# Patient Record
Sex: Female | Born: 1965 | Race: White | Hispanic: No | Marital: Married | Smoking: Never smoker
Health system: Southern US, Community
[De-identification: ages and names within clinical notes are randomized; demographics above are authoritative.]

## PROBLEM LIST (undated history)

## (undated) DIAGNOSIS — G4700 Insomnia, unspecified: Secondary | ICD-10-CM

## (undated) DIAGNOSIS — R1084 Generalized abdominal pain: Secondary | ICD-10-CM

## (undated) DIAGNOSIS — K5792 Diverticulitis of intestine, part unspecified, without perforation or abscess without bleeding: Secondary | ICD-10-CM

## (undated) DIAGNOSIS — R194 Change in bowel habit: Secondary | ICD-10-CM

## (undated) DIAGNOSIS — N200 Calculus of kidney: Secondary | ICD-10-CM

## (undated) DIAGNOSIS — Z83438 Family history of other disorder of lipoprotein metabolism and other lipidemia: Secondary | ICD-10-CM

## (undated) DIAGNOSIS — R1013 Epigastric pain: Secondary | ICD-10-CM

## (undated) DIAGNOSIS — E785 Hyperlipidemia, unspecified: Secondary | ICD-10-CM

## (undated) DIAGNOSIS — E7800 Pure hypercholesterolemia, unspecified: Secondary | ICD-10-CM

## (undated) DIAGNOSIS — R112 Nausea with vomiting, unspecified: Secondary | ICD-10-CM

## (undated) DIAGNOSIS — N3000 Acute cystitis without hematuria: Secondary | ICD-10-CM

## (undated) DIAGNOSIS — K625 Hemorrhage of anus and rectum: Secondary | ICD-10-CM

## (undated) DIAGNOSIS — Z8616 Personal history of COVID-19: Secondary | ICD-10-CM

## (undated) DIAGNOSIS — F419 Anxiety disorder, unspecified: Secondary | ICD-10-CM

## (undated) DIAGNOSIS — G500 Trigeminal neuralgia: Secondary | ICD-10-CM

## (undated) DIAGNOSIS — K5900 Constipation, unspecified: Secondary | ICD-10-CM

## (undated) DIAGNOSIS — R203 Hyperesthesia: Secondary | ICD-10-CM

## (undated) DIAGNOSIS — R109 Unspecified abdominal pain: Secondary | ICD-10-CM

## (undated) DIAGNOSIS — Z8249 Family history of ischemic heart disease and other diseases of the circulatory system: Secondary | ICD-10-CM

## (undated) DIAGNOSIS — R599 Enlarged lymph nodes, unspecified: Secondary | ICD-10-CM

## (undated) HISTORY — DX: Unspecified abdominal pain: R10.9

## (undated) HISTORY — DX: Family history of other disorder of lipoprotein metabolism and other lipidemia: Z83.438

## (undated) HISTORY — DX: Calculus of kidney: N20.0

## (undated) HISTORY — PX: SHOULDER SURGERY: SHX246

## (undated) HISTORY — DX: Anxiety disorder, unspecified: F41.9

## (undated) HISTORY — DX: Diverticulitis of intestine, part unspecified, without perforation or abscess without bleeding: K57.92

## (undated) HISTORY — DX: Nausea with vomiting, unspecified: R11.2

## (undated) HISTORY — DX: Hemorrhage of anus and rectum: K62.5

## (undated) HISTORY — DX: Acute cystitis without hematuria: N30.00

## (undated) HISTORY — DX: Hyperlipidemia, unspecified: E78.5

## (undated) HISTORY — DX: Constipation, unspecified: K59.00

## (undated) HISTORY — DX: Insomnia, unspecified: G47.00

## (undated) HISTORY — DX: Personal history of COVID-19: Z86.16

## (undated) HISTORY — DX: Generalized abdominal pain: R10.84

## (undated) HISTORY — DX: Enlarged lymph nodes, unspecified: R59.9

## (undated) HISTORY — DX: Trigeminal neuralgia: G50.0

## (undated) HISTORY — DX: Pure hypercholesterolemia, unspecified: E78.00

## (undated) HISTORY — DX: Family history of ischemic heart disease and other diseases of the circulatory system: Z82.49

## (undated) HISTORY — DX: Epigastric pain: R10.13

## (undated) HISTORY — DX: Change in bowel habit: R19.4

## (undated) HISTORY — DX: Hyperesthesia: R20.3

---

## 1999-05-10 ENCOUNTER — Other Ambulatory Visit: Admission: RE | Admit: 1999-05-10 | Discharge: 1999-05-10 | Payer: Self-pay | Admitting: *Deleted

## 2001-04-16 ENCOUNTER — Encounter: Payer: Self-pay | Admitting: Family Medicine

## 2001-04-16 ENCOUNTER — Ambulatory Visit (HOSPITAL_COMMUNITY): Admission: RE | Admit: 2001-04-16 | Discharge: 2001-04-16 | Payer: Self-pay | Admitting: Family Medicine

## 2001-12-03 ENCOUNTER — Encounter: Payer: Self-pay | Admitting: Orthopedic Surgery

## 2001-12-03 ENCOUNTER — Ambulatory Visit (HOSPITAL_COMMUNITY): Admission: RE | Admit: 2001-12-03 | Discharge: 2001-12-03 | Payer: Self-pay | Admitting: Orthopedic Surgery

## 2002-03-22 ENCOUNTER — Encounter: Payer: Self-pay | Admitting: Family Medicine

## 2002-03-22 ENCOUNTER — Ambulatory Visit (HOSPITAL_COMMUNITY): Admission: RE | Admit: 2002-03-22 | Discharge: 2002-03-22 | Payer: Self-pay | Admitting: Family Medicine

## 2003-03-30 ENCOUNTER — Other Ambulatory Visit: Admission: RE | Admit: 2003-03-30 | Discharge: 2003-03-30 | Payer: Self-pay | Admitting: Family Medicine

## 2004-08-02 ENCOUNTER — Other Ambulatory Visit: Admission: RE | Admit: 2004-08-02 | Discharge: 2004-08-02 | Payer: Self-pay | Admitting: Family Medicine

## 2005-08-20 ENCOUNTER — Other Ambulatory Visit: Admission: RE | Admit: 2005-08-20 | Discharge: 2005-08-20 | Payer: Self-pay | Admitting: Family Medicine

## 2005-11-26 ENCOUNTER — Encounter: Admission: RE | Admit: 2005-11-26 | Discharge: 2005-11-26 | Payer: Self-pay | Admitting: Family Medicine

## 2006-07-09 ENCOUNTER — Encounter: Admission: RE | Admit: 2006-07-09 | Discharge: 2006-07-09 | Payer: Self-pay | Admitting: Family Medicine

## 2006-11-03 ENCOUNTER — Other Ambulatory Visit: Admission: RE | Admit: 2006-11-03 | Discharge: 2006-11-03 | Payer: Self-pay | Admitting: Family Medicine

## 2006-12-02 ENCOUNTER — Encounter: Admission: RE | Admit: 2006-12-02 | Discharge: 2006-12-02 | Payer: Self-pay | Admitting: Family Medicine

## 2008-03-01 ENCOUNTER — Other Ambulatory Visit: Admission: RE | Admit: 2008-03-01 | Discharge: 2008-03-01 | Payer: Self-pay | Admitting: Family Medicine

## 2008-03-03 ENCOUNTER — Encounter: Admission: RE | Admit: 2008-03-03 | Discharge: 2008-03-03 | Payer: Self-pay | Admitting: Family Medicine

## 2008-05-08 ENCOUNTER — Emergency Department (HOSPITAL_BASED_OUTPATIENT_CLINIC_OR_DEPARTMENT_OTHER): Admission: EM | Admit: 2008-05-08 | Discharge: 2008-05-08 | Payer: Self-pay | Admitting: Emergency Medicine

## 2009-05-02 ENCOUNTER — Other Ambulatory Visit: Admission: RE | Admit: 2009-05-02 | Discharge: 2009-05-02 | Payer: Self-pay | Admitting: Family Medicine

## 2010-07-09 ENCOUNTER — Other Ambulatory Visit: Payer: Self-pay | Admitting: Family Medicine

## 2010-07-09 ENCOUNTER — Other Ambulatory Visit (HOSPITAL_COMMUNITY)
Admission: RE | Admit: 2010-07-09 | Discharge: 2010-07-09 | Disposition: A | Discharge status: Home / Self Care | Payer: Federal, State, Local not specified - PPO | Source: Ambulatory Visit | Attending: Family Medicine | Admitting: Family Medicine

## 2010-07-09 DIAGNOSIS — Z01419 Encounter for gynecological examination (general) (routine) without abnormal findings: Secondary | ICD-10-CM | POA: Insufficient documentation

## 2011-02-18 ENCOUNTER — Emergency Department (HOSPITAL_BASED_OUTPATIENT_CLINIC_OR_DEPARTMENT_OTHER)
Admission: EM | Admit: 2011-02-18 | Discharge: 2011-02-18 | Disposition: A | Discharge status: Home / Self Care | Payer: Federal, State, Local not specified - PPO | Attending: Emergency Medicine | Admitting: Emergency Medicine

## 2011-02-18 ENCOUNTER — Emergency Department (INDEPENDENT_AMBULATORY_CARE_PROVIDER_SITE_OTHER): Payer: Federal, State, Local not specified - PPO

## 2011-02-18 ENCOUNTER — Other Ambulatory Visit: Payer: Self-pay

## 2011-02-18 ENCOUNTER — Encounter (HOSPITAL_BASED_OUTPATIENT_CLINIC_OR_DEPARTMENT_OTHER): Payer: Self-pay | Admitting: *Deleted

## 2011-02-18 DIAGNOSIS — E7800 Pure hypercholesterolemia, unspecified: Secondary | ICD-10-CM | POA: Insufficient documentation

## 2011-02-18 DIAGNOSIS — R079 Chest pain, unspecified: Secondary | ICD-10-CM

## 2011-02-18 DIAGNOSIS — J45909 Unspecified asthma, uncomplicated: Secondary | ICD-10-CM | POA: Insufficient documentation

## 2011-02-18 DIAGNOSIS — R0789 Other chest pain: Secondary | ICD-10-CM | POA: Insufficient documentation

## 2011-02-18 HISTORY — DX: Pure hypercholesterolemia, unspecified: E78.00

## 2011-02-18 MED ORDER — AZITHROMYCIN 250 MG PO TABS: 250.0000 mg | ORAL_TABLET | Freq: Every day | ORAL | Status: AC

## 2011-02-18 MED ORDER — IBUPROFEN 800 MG PO TABS: 800.0000 mg | ORAL_TABLET | Freq: Three times a day (TID) | ORAL | Status: AC

## 2011-02-18 MED ORDER — IBUPROFEN 800 MG PO TABS
800.0000 mg | ORAL_TABLET | Freq: Once | ORAL | Status: AC
  Administered 2011-02-18: 800 mg via ORAL
  Filled 2011-02-18: qty 1

## 2011-02-18 NOTE — ED Notes (Signed)
Right sided chest pain when she takes a deep breath. Hx of cough and congestion x 1 week.

## 2011-02-18 NOTE — ED Provider Notes (Signed)
History     CSN: [id number]  Arrival date & time 02/18/11  2118   First MD Initiated Contact with Patient 02/18/11 2259      Chief Complaint  Patient presents with  . Chest Pain    (Consider location/radiation/quality/duration/timing/severity/associated sxs/prior treatment) Patient is a 46 y.o. female presenting with chest pain. The history is provided by the patient. No language interpreter was used.  Chest Pain The chest pain began yesterday. Duration of episode(s) is 24 hours. Chest pain occurs constantly. The chest pain is unchanged. The pain is associated with exertion. At its most intense, the pain is at 6/10. The pain is currently at 6/10. The severity of the pain is moderate. The quality of the pain is described as aching. The pain radiates to the upper back. Chest pain is worsened by certain positions. Primary symptoms include cough. Pertinent negatives for primary symptoms include no shortness of breath.  Pertinent negatives for associated symptoms include no claudication and no orthopnea. She tried aspirin for the symptoms.  Her past medical history is significant for hyperlipidemia.  Her family medical history is significant for hyperlipidemia in family. Procedure history comments: no hx of cardiac evaluation.   Pt reports she has had a cough for the past week.  Pt reports husband bronchitis and then she became sick.  Pt reports she has coughed up some colored phelgm.  Pt complains of congestion.  Pt does not have pain unless she is moving or coughing.    Past Medical History  Diagnosis Date  . High cholesterol   . Asthma     Past Surgical History  Procedure Date  . Shoulder surgery     No family history on file.  History  Substance Use Topics  . Smoking status: Never Smoker   . Smokeless tobacco: Not on file  . Alcohol Use: No    OB History    Grav Para Term Preterm Abortions TAB SAB Ect Mult Living                  Review of Systems  Respiratory:  Positive for cough. Negative for shortness of breath.   Cardiovascular: Positive for chest pain. Negative for orthopnea and claudication.  All other systems reviewed and are negative.    Allergies  Review of patient's allergies indicates no known allergies.  Home Medications   Current Outpatient Rx  Name Route Sig Dispense Refill  . NORGESTREL-ETHINYL ESTRADIOL 0.3-30 MG-MCG PO TABS Oral Take 1 tablet by mouth at bedtime.    Marland Kitchen PHENYLEPHRINE-DM-GG-APAP 5-10-200-325 MG PO TABS Oral Take 2 tablets by mouth every 4 (four) hours as needed. For congestion      BP 141/84  Pulse 100  Temp(Src) 98.3 F (36.8 C) (Oral)  Resp 20  SpO2 100%  LMP 02/11/2011  Physical Exam  Nursing note and vitals reviewed. Constitutional: She is oriented to person, place, and time. She appears well-developed and well-nourished.  HENT:  Head: Normocephalic and atraumatic.  Right Ear: External ear normal.  Left Ear: External ear normal.  Nose: Nose normal.  Mouth/Throat: Oropharynx is clear and moist.  Eyes: Conjunctivae and EOM are normal. Pupils are equal, round, and reactive to light.  Neck: Normal range of motion. Neck supple.  Cardiovascular: Normal rate and normal heart sounds.   Pulmonary/Chest: Effort normal and breath sounds normal.  Abdominal: Soft.  Musculoskeletal: Normal range of motion.  Neurological: She is alert and oriented to person, place, and time. She has normal reflexes.  Skin: Skin  is warm.  Psychiatric: She has a normal mood and affect.    ED Course  Procedures (including critical care time)  Labs Reviewed - No data to display Dg Chest 2 View  02/18/2011  *RADIOLOGY REPORT*  Clinical Data: Right chest pain  CHEST - 2 VIEW  Comparison: None.  Findings: No pneumothorax. Lungs clear.  Heart size and pulmonary vascularity normal.  No effusion.  Visualized bones unremarkable.  IMPRESSION: No acute disease  Original Report Authenticated By: Osa Craver, M.D.     No  diagnosis found.    MDM   Date: 02/18/2011  Rate: 75  Rhythm: normal sinus rhythm  QRS Axis: normal  Intervals: normal  ST/T Wave abnormalities: normal  Conduction Disutrbances:none  Narrative Interpretation:   Old EKG Reviewed: none available     Pt given ibuprofen.   Pt given zithromax and rx for ibuprofen    Langston Masker, [place] 02/18/11 2334

## 2011-02-19 NOTE — ED Provider Notes (Signed)
Medical screening examination/treatment/procedure(s) were performed by non-physician practitioner and as supervising physician I was immediately available for consultation/collaboration.   Hanley Seamen, MD 02/19/11 [phone]

## 2011-07-17 ENCOUNTER — Other Ambulatory Visit: Payer: Self-pay | Admitting: Family Medicine

## 2011-07-17 ENCOUNTER — Other Ambulatory Visit (HOSPITAL_COMMUNITY)
Admission: RE | Admit: 2011-07-17 | Discharge: 2011-07-17 | Disposition: A | Discharge status: Home / Self Care | Payer: Federal, State, Local not specified - PPO | Source: Ambulatory Visit | Attending: Family Medicine | Admitting: Family Medicine

## 2011-07-17 DIAGNOSIS — Z01419 Encounter for gynecological examination (general) (routine) without abnormal findings: Secondary | ICD-10-CM | POA: Insufficient documentation

## 2012-02-20 ENCOUNTER — Other Ambulatory Visit: Payer: Self-pay | Admitting: Family Medicine

## 2012-02-20 DIAGNOSIS — Z1231 Encounter for screening mammogram for malignant neoplasm of breast: Secondary | ICD-10-CM

## 2012-03-23 ENCOUNTER — Ambulatory Visit
Admission: RE | Admit: 2012-03-23 | Discharge: 2012-03-23 | Disposition: A | Discharge status: Home / Self Care | Payer: Federal, State, Local not specified - PPO | Source: Ambulatory Visit | Attending: Family Medicine | Admitting: Family Medicine

## 2012-03-23 DIAGNOSIS — Z1231 Encounter for screening mammogram for malignant neoplasm of breast: Secondary | ICD-10-CM

## 2012-08-25 ENCOUNTER — Other Ambulatory Visit: Payer: Self-pay | Admitting: Family Medicine

## 2012-08-25 ENCOUNTER — Other Ambulatory Visit (HOSPITAL_COMMUNITY)
Admission: RE | Admit: 2012-08-25 | Discharge: 2012-08-25 | Disposition: A | Discharge status: Home / Self Care | Payer: Federal, State, Local not specified - PPO | Source: Ambulatory Visit | Attending: Family Medicine | Admitting: Family Medicine

## 2012-08-25 DIAGNOSIS — Z1151 Encounter for screening for human papillomavirus (HPV): Secondary | ICD-10-CM | POA: Insufficient documentation

## 2012-08-25 DIAGNOSIS — Z01419 Encounter for gynecological examination (general) (routine) without abnormal findings: Secondary | ICD-10-CM | POA: Insufficient documentation

## 2013-05-03 ENCOUNTER — Other Ambulatory Visit: Payer: Self-pay

## 2013-05-03 DIAGNOSIS — Z1231 Encounter for screening mammogram for malignant neoplasm of breast: Secondary | ICD-10-CM

## 2013-05-10 ENCOUNTER — Encounter (INDEPENDENT_AMBULATORY_CARE_PROVIDER_SITE_OTHER): Payer: Self-pay

## 2013-05-10 ENCOUNTER — Ambulatory Visit
Admission: RE | Admit: 2013-05-10 | Discharge: 2013-05-10 | Disposition: A | Discharge status: Home / Self Care | Payer: Self-pay | Source: Ambulatory Visit

## 2013-05-10 DIAGNOSIS — Z1231 Encounter for screening mammogram for malignant neoplasm of breast: Secondary | ICD-10-CM

## 2013-09-17 ENCOUNTER — Other Ambulatory Visit: Payer: Self-pay | Admitting: Family Medicine

## 2013-09-17 ENCOUNTER — Ambulatory Visit
Admission: RE | Admit: 2013-09-17 | Discharge: 2013-09-17 | Disposition: A | Discharge status: Home / Self Care | Payer: Federal, State, Local not specified - PPO | Source: Ambulatory Visit | Attending: Family Medicine | Admitting: Family Medicine

## 2013-09-17 DIAGNOSIS — R109 Unspecified abdominal pain: Secondary | ICD-10-CM

## 2013-09-17 DIAGNOSIS — R319 Hematuria, unspecified: Secondary | ICD-10-CM

## 2013-09-17 MED ORDER — IOHEXOL 300 MG/ML  SOLN
100.0000 mL | Freq: Once | INTRAMUSCULAR | Status: AC | PRN
  Administered 2013-09-17: 100 mL via INTRAVENOUS

## 2013-09-17 MED ORDER — IOHEXOL 300 MG/ML  SOLN
30.0000 mL | Freq: Once | INTRAMUSCULAR | Status: AC | PRN
  Administered 2013-09-17: 30 mL via ORAL

## 2013-10-12 ENCOUNTER — Other Ambulatory Visit (HOSPITAL_COMMUNITY)
Admission: RE | Admit: 2013-10-12 | Discharge: 2013-10-12 | Disposition: A | Discharge status: Home / Self Care | Payer: Federal, State, Local not specified - PPO | Source: Ambulatory Visit | Attending: Family Medicine | Admitting: Family Medicine

## 2013-10-12 ENCOUNTER — Other Ambulatory Visit: Payer: Self-pay | Admitting: Family Medicine

## 2013-10-12 DIAGNOSIS — Z01419 Encounter for gynecological examination (general) (routine) without abnormal findings: Secondary | ICD-10-CM | POA: Diagnosis not present

## 2013-10-14 LAB — CYTOLOGY - PAP

## 2014-10-17 ENCOUNTER — Other Ambulatory Visit: Payer: Self-pay

## 2014-10-17 DIAGNOSIS — Z1231 Encounter for screening mammogram for malignant neoplasm of breast: Secondary | ICD-10-CM

## 2014-11-15 ENCOUNTER — Ambulatory Visit
Admission: RE | Admit: 2014-11-15 | Discharge: 2014-11-15 | Disposition: A | Discharge status: Home / Self Care | Payer: Federal, State, Local not specified - PPO | Source: Ambulatory Visit

## 2014-11-15 DIAGNOSIS — Z1231 Encounter for screening mammogram for malignant neoplasm of breast: Secondary | ICD-10-CM

## 2014-11-17 ENCOUNTER — Other Ambulatory Visit: Payer: Self-pay | Admitting: Family Medicine

## 2014-11-17 DIAGNOSIS — R928 Other abnormal and inconclusive findings on diagnostic imaging of breast: Secondary | ICD-10-CM

## 2014-11-23 ENCOUNTER — Ambulatory Visit
Admission: RE | Admit: 2014-11-23 | Discharge: 2014-11-23 | Disposition: A | Discharge status: Home / Self Care | Payer: Federal, State, Local not specified - PPO | Source: Ambulatory Visit | Attending: Family Medicine | Admitting: Family Medicine

## 2014-11-23 DIAGNOSIS — R928 Other abnormal and inconclusive findings on diagnostic imaging of breast: Secondary | ICD-10-CM

## 2014-11-25 ENCOUNTER — Other Ambulatory Visit: Payer: Self-pay | Admitting: Family Medicine

## 2014-11-25 ENCOUNTER — Ambulatory Visit
Admission: RE | Admit: 2014-11-25 | Discharge: 2014-11-25 | Disposition: A | Discharge status: Home / Self Care | Payer: Federal, State, Local not specified - PPO | Source: Ambulatory Visit | Attending: Family Medicine | Admitting: Family Medicine

## 2014-11-25 DIAGNOSIS — R109 Unspecified abdominal pain: Secondary | ICD-10-CM

## 2014-12-15 ENCOUNTER — Other Ambulatory Visit: Payer: Self-pay | Admitting: Gastroenterology

## 2014-12-15 DIAGNOSIS — R1084 Generalized abdominal pain: Secondary | ICD-10-CM

## 2014-12-23 ENCOUNTER — Ambulatory Visit
Admission: RE | Admit: 2014-12-23 | Discharge: 2014-12-23 | Disposition: A | Discharge status: Home / Self Care | Payer: Federal, State, Local not specified - PPO | Source: Ambulatory Visit | Attending: Gastroenterology | Admitting: Gastroenterology

## 2014-12-23 DIAGNOSIS — R1084 Generalized abdominal pain: Secondary | ICD-10-CM

## 2014-12-23 MED ORDER — IOPAMIDOL (ISOVUE-300) INJECTION 61%
100.0000 mL | Freq: Once | INTRAVENOUS | Status: AC | PRN
  Administered 2014-12-23: 100 mL via INTRAVENOUS

## 2015-11-01 ENCOUNTER — Other Ambulatory Visit (INDEPENDENT_AMBULATORY_CARE_PROVIDER_SITE_OTHER): Payer: Federal, State, Local not specified - PPO

## 2015-11-01 ENCOUNTER — Encounter: Payer: Self-pay | Admitting: Neurology

## 2015-11-01 ENCOUNTER — Ambulatory Visit (INDEPENDENT_AMBULATORY_CARE_PROVIDER_SITE_OTHER): Payer: Federal, State, Local not specified - PPO | Admitting: Neurology

## 2015-11-01 VITALS — BP 108/74 | HR 65 | Ht 61.5 in | Wt 113.4 lb

## 2015-11-01 DIAGNOSIS — R292 Abnormal reflex: Secondary | ICD-10-CM

## 2015-11-01 DIAGNOSIS — G5792 Unspecified mononeuropathy of left lower limb: Secondary | ICD-10-CM

## 2015-11-01 DIAGNOSIS — R202 Paresthesia of skin: Secondary | ICD-10-CM | POA: Diagnosis not present

## 2015-11-01 DIAGNOSIS — M792 Neuralgia and neuritis, unspecified: Secondary | ICD-10-CM | POA: Insufficient documentation

## 2015-11-01 LAB — TSH: TSH: 1.21 u[IU]/mL (ref 0.35–4.50)

## 2015-11-01 LAB — VITAMIN B12: VITAMIN B 12: 268 pg/mL (ref 211–911)

## 2015-11-01 LAB — SEDIMENTATION RATE: Sed Rate: 6 mm/hr (ref 0–30)

## 2015-11-01 MED ORDER — GABAPENTIN 300 MG PO CAPS: 300.0000 mg | ORAL_CAPSULE | Freq: Every day | ORAL | 3 refills | Status: DC

## 2015-11-01 NOTE — Progress Notes (Signed)
[facility] Clinic Note - Initial Visit   Date: 11/01/15  Mary Fisher MRN: [id number] DOB: [birth year]   Dear Dr. Alyson Ingles:  Thank you for your kind referral of Mary Fisher [facility] for consultation of skin hypersensitivity. Although her history is well known to you, please allow [place] to reiterate it for the purpose of our medical record. The patient was accompanied to the clinic by self.    History of Present Illness: Mary Fisher is a 50 y.o. right-handed Caucasian female with hyperlipidemia and insonmia presenting for evaluation of disturbance of skin sensation.    Starting in mid-September, she began having a burning sensation over the left flank. She initially thought it may be due to using a heating pad.  She has constant sensitivity of the skin, which is worse if her clothes touch her skin.  The area does not itch.  She was treated for prednisone and muscle relaxer which did not provide any relief.  Over the past few weeks, she developed new achy and deep pain in her left abdomen.  One week ago, she had severe nausea and vomiting which lasted about 24 hours.  She saw gastroenterology last week and will be having colonoscopy and endoscopy to further evaluate symptoms.    She denies any weakness, numbness/tingling of the legs or back, or muscle spasms.    Out-side paper records, electronic medical record, and images have been reviewed where available and summarized as:  Labs 10/26/2015:  ESR 5, CRP 0.7, CMP and CBC normal  Past Medical History:  Diagnosis Date  . Asthma   . High cholesterol     Past Surgical History:  Procedure Laterality Date  . SHOULDER SURGERY       Medications:  Outpatient Encounter Prescriptions as of 11/01/2015  Medication Sig Note  . atorvastatin (LIPITOR) 20 MG tablet Take 20 mg by mouth daily.   Marland Kitchen zolpidem (AMBIEN) 10 MG tablet  11/01/2015: Received from: External Pharmacy  . [DISCONTINUED] norgestrel-ethinyl estradiol  (LOW-OGESTREL) 0.3-30 MG-MCG tablet Take 1 tablet by mouth at bedtime.   . [DISCONTINUED] Phenylephrine-DM-GG-APAP (TYLENOL COLD MULTI-SYMPTOM) 5-10-200-325 MG TABS Take 2 tablets by mouth every 4 (four) hours as needed. For congestion    No facility-administered encounter medications on file as of 11/01/2015.      Allergies: No Known Allergies  Family History: No family history on file.  Social History: Social History  Substance Use Topics  . Smoking status: Never Smoker  . Smokeless tobacco: Never Used  . Alcohol use No   Social History   Social History Narrative  . No narrative on file    Review of Systems:  CONSTITUTIONAL: No fevers, chills, night sweats, or weight loss.   EYES: No visual changes or eye pain ENT: No hearing changes.  No history of nose bleeds.   RESPIRATORY: No cough, wheezing and shortness of breath.   CARDIOVASCULAR: Negative for chest pain, and palpitations.   GI: Negative for abdominal discomfort, blood in stools or black stools.  No recent change in bowel habits.   GU:  No history of incontinence.   MUSCLOSKELETAL: No history of joint pain or swelling.  No myalgias.   SKIN: Negative for lesions, rash, and itching.   HEMATOLOGY/ONCOLOGY: Negative for prolonged bleeding, bruising easily, and swollen nodes.  No history of cancer.   ENDOCRINE: Negative for cold or heat intolerance, polydipsia or goiter.   PSYCH:  No depression or anxiety symptoms.   NEURO: As Above.   Vital Signs:  BP 108/74   Pulse 65   Ht 5' 1.5" (1.562 m)   Wt 113 lb 6 oz (51.4 kg)   SpO2 98%   BMI 21.08 kg/m    General Medical Exam:   General:  Well appearing, comfortable.   Eyes/ENT: see cranial nerve examination.   Neck: No masses appreciated.  Full range of motion without tenderness.  No carotid bruits. Respiratory:  Clear to auscultation, good air entry bilaterally.   Cardiac:  Regular rate and rhythm, no murmur.   Extremities:  No deformities, edema, or skin  discoloration.  Skin:  No rashes or lesions.  Neurological Exam: MENTAL STATUS including orientation to time, place, person, recent and remote memory, attention span and concentration, language, and fund of knowledge is normal.  Speech is not dysarthric.  CRANIAL NERVES: II:  No visual field defects.  Unremarkable fundi.   III-IV-VI: Pupils equal round and reactive to light.  Normal conjugate, extra-ocular eye movements in all directions of gaze.  No nystagmus.  No ptosis.   V:  Normal facial sensation.    VII:  Normal facial symmetry and movements.  No pathologic facial reflexes.  VIII:  Normal hearing and vestibular function.   IX-X:  Normal palatal movement.   XI:  Normal shoulder shrug and head rotation.   XII:  Normal tongue strength and range of motion, no deviation or fasciculation.  MOTOR:  Motor strength is 5/5 throughout.  No atrophy, fasciculations or abnormal movements.  No pronator drift.  Tone is normal.    MSRs:  Right                                                                 Left brachioradialis 3+  brachioradialis 3+  biceps 3+  biceps 3+  triceps 3+  triceps 3+  patellar 3+  patellar 3+  ankle jerk 2+  ankle jerk 2+  Hoffman no  Hoffman no  plantar response down  plantar response down   SENSORY:  Temperature and pin prick is reduced by 10% over a very localized region around the lateral to the left paraspinal muscles at T10.  Sensory changes do not conform to a dermatomal pattern.    Normal and symmetric perception of light touch, pinprick, vibration, and proprioception of the extremities.  Romberg's sign absent.   COORDINATION/GAIT: Normal finger-to- nose-finger.   Intact rapid alternating movements bilaterally. Gait narrow based and stable. Tandem and stressed gait intact.    IMPRESSION: Mary Fisher is a 50 year-old female referred for evaluation of hypersensitivity over a localized region of lower thoracic region.  She does not have radicular pain, but  with her brisk reflexes, I want to be sure there is no underlying thoracic nerve impingement.  She will also be screened for inflammatory and nutritional conditions which can cause paresthesias.   In the meantime, she can try gabapentin for symptom management.  She is also seeing gastroenterology for evaluation of her abdominal and flank pain.   PLAN/RECOMMENDATIONS:  1.  MRI thoracic spine with and without contrast 2.  Check ESR, TSH, vitamin B12, copper, zinc 3.  Start gabapentin 366m at bedtime.  Further recommendations will be based on the results of her testing.    The duration of this appointment visit was 45 minutes  of face-to-face time with the patient.  Greater than 50% of this time was spent in counseling, explanation of diagnosis, planning of further management, and coordination of care.   Thank you for allowing me to participate in patient's care.  If I can answer any additional questions, I would be pleased to do so.    Sincerely,    Iretta Mangrum K. Posey Pronto, DO

## 2015-11-01 NOTE — Patient Instructions (Addendum)
1.  MRI thoracic spine with and without contrast 2.  Check blood work 3.  Start gabapentin 300mg  at bedtime.  We will call you with the results of the testing and decide the next step.

## 2015-11-03 ENCOUNTER — Telehealth: Payer: Self-pay

## 2015-11-03 ENCOUNTER — Telehealth: Payer: Self-pay | Admitting: Neurology

## 2015-11-03 LAB — COPPER, SERUM: Copper: 108 ug/dL (ref 72–166)

## 2015-11-03 LAB — ZINC: ZINC: 72 ug/dL (ref 60–130)

## 2015-11-03 NOTE — Telephone Encounter (Signed)
Patient states that she got a call about blood work please call [phone]

## 2015-11-03 NOTE — Telephone Encounter (Signed)
Patient called office back not realizing it was us that she spoke with this morning.

## 2015-11-03 NOTE — Telephone Encounter (Signed)
Mary Fisher- looks like you spoke with her. Can you call her back?

## 2015-11-03 NOTE — Telephone Encounter (Signed)
Notified patient bloodwork is normal, there is only one result pending per Dr. Karel JarvisAquino.

## 2015-11-13 ENCOUNTER — Ambulatory Visit
Admission: RE | Admit: 2015-11-13 | Discharge: 2015-11-13 | Disposition: A | Discharge status: Home / Self Care | Payer: Federal, State, Local not specified - PPO | Source: Ambulatory Visit | Attending: Neurology | Admitting: Neurology

## 2015-11-13 DIAGNOSIS — R202 Paresthesia of skin: Secondary | ICD-10-CM

## 2015-11-13 DIAGNOSIS — M792 Neuralgia and neuritis, unspecified: Secondary | ICD-10-CM

## 2015-11-13 DIAGNOSIS — R292 Abnormal reflex: Secondary | ICD-10-CM

## 2015-11-13 MED ORDER — GADOBENATE DIMEGLUMINE 529 MG/ML IV SOLN
10.0000 mL | Freq: Once | INTRAVENOUS | Status: AC | PRN
  Administered 2015-11-13: 10 mL via INTRAVENOUS

## 2015-12-22 ENCOUNTER — Ambulatory Visit: Payer: Federal, State, Local not specified - PPO | Admitting: Neurology

## 2016-02-13 ENCOUNTER — Other Ambulatory Visit: Payer: Self-pay | Admitting: Family Medicine

## 2016-02-13 DIAGNOSIS — Z1231 Encounter for screening mammogram for malignant neoplasm of breast: Secondary | ICD-10-CM

## 2016-03-01 ENCOUNTER — Ambulatory Visit
Admission: RE | Admit: 2016-03-01 | Discharge: 2016-03-01 | Disposition: A | Discharge status: Home / Self Care | Payer: Federal, State, Local not specified - PPO | Source: Ambulatory Visit | Attending: Family Medicine | Admitting: Family Medicine

## 2016-03-01 DIAGNOSIS — Z1231 Encounter for screening mammogram for malignant neoplasm of breast: Secondary | ICD-10-CM

## 2017-03-03 ENCOUNTER — Other Ambulatory Visit: Payer: Self-pay | Admitting: Obstetrics & Gynecology

## 2017-03-03 DIAGNOSIS — Z139 Encounter for screening, unspecified: Secondary | ICD-10-CM

## 2017-03-07 ENCOUNTER — Other Ambulatory Visit: Payer: Self-pay | Admitting: Obstetrics & Gynecology

## 2017-03-07 DIAGNOSIS — Z780 Asymptomatic menopausal state: Secondary | ICD-10-CM

## 2017-03-21 ENCOUNTER — Ambulatory Visit
Admission: RE | Admit: 2017-03-21 | Discharge: 2017-03-21 | Disposition: A | Discharge status: Home / Self Care | Payer: Federal, State, Local not specified - PPO | Source: Ambulatory Visit | Attending: Family Medicine | Admitting: Family Medicine

## 2017-03-21 ENCOUNTER — Other Ambulatory Visit: Payer: Self-pay | Admitting: Family Medicine

## 2017-03-21 DIAGNOSIS — R1032 Left lower quadrant pain: Secondary | ICD-10-CM

## 2017-10-23 IMAGING — MG MM SCREENING BREAST TOMO BILATERAL
8 series · 9 of 24 positions shown · non-contrast
Comparison: Previous exam(s).

CLINICAL DATA: Screening.

EXAM:
DIGITAL SCREENING BILATERAL MAMMOGRAM WITH 3D TOMO WITH CAD

[L CC]
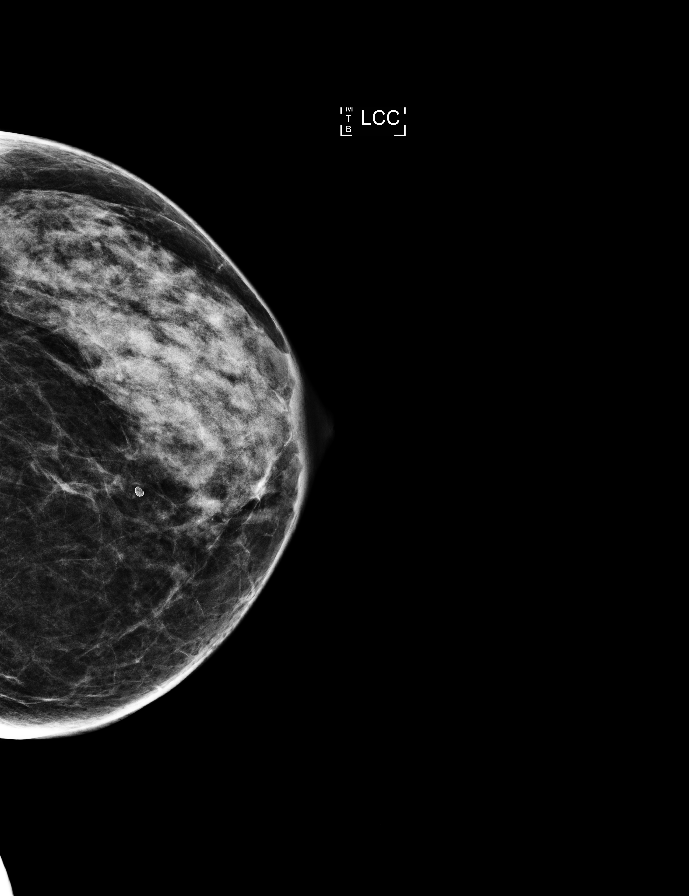

[R CC]
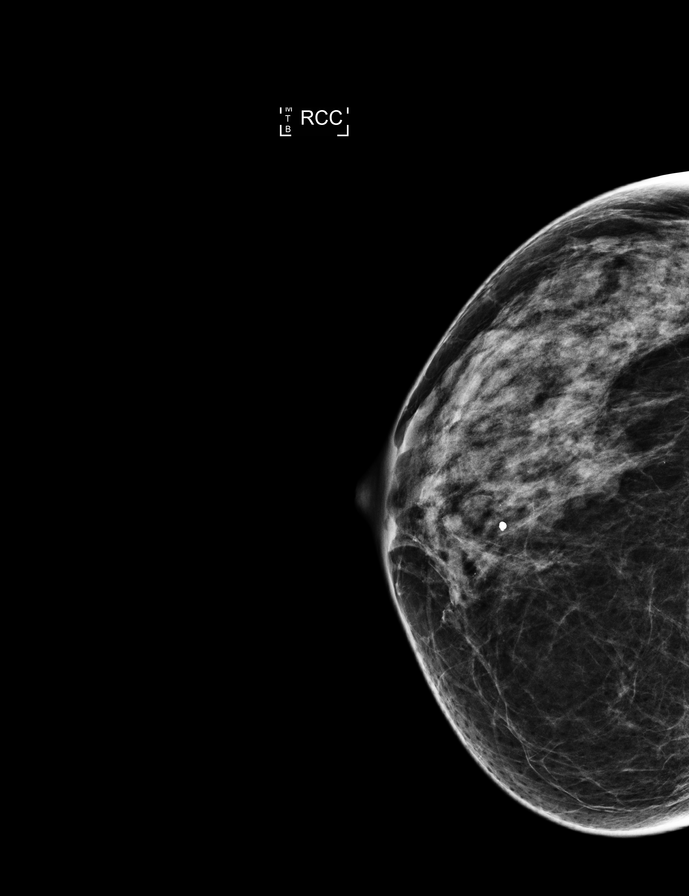

[L MLO]
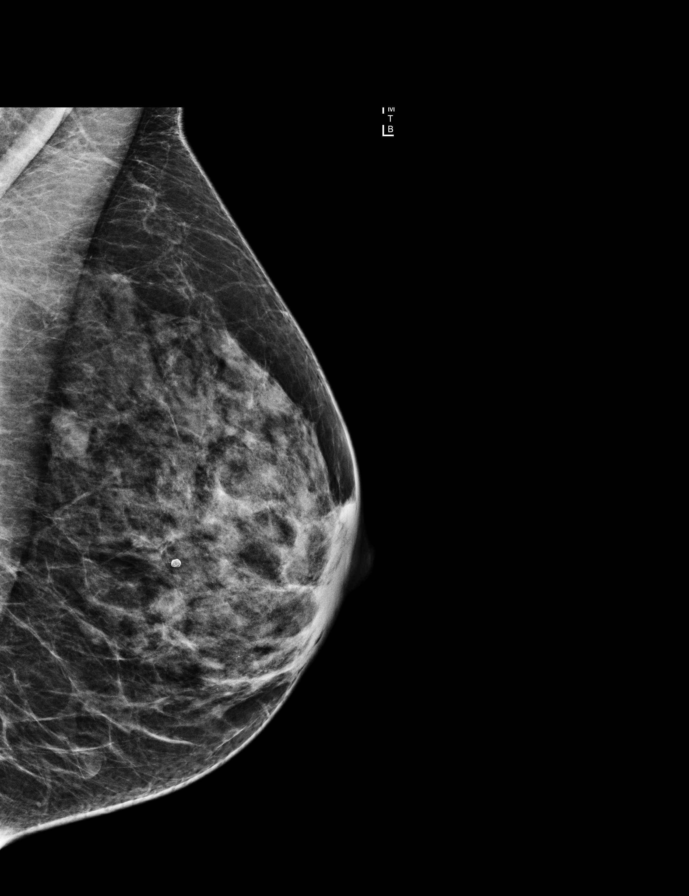

[R MLO]
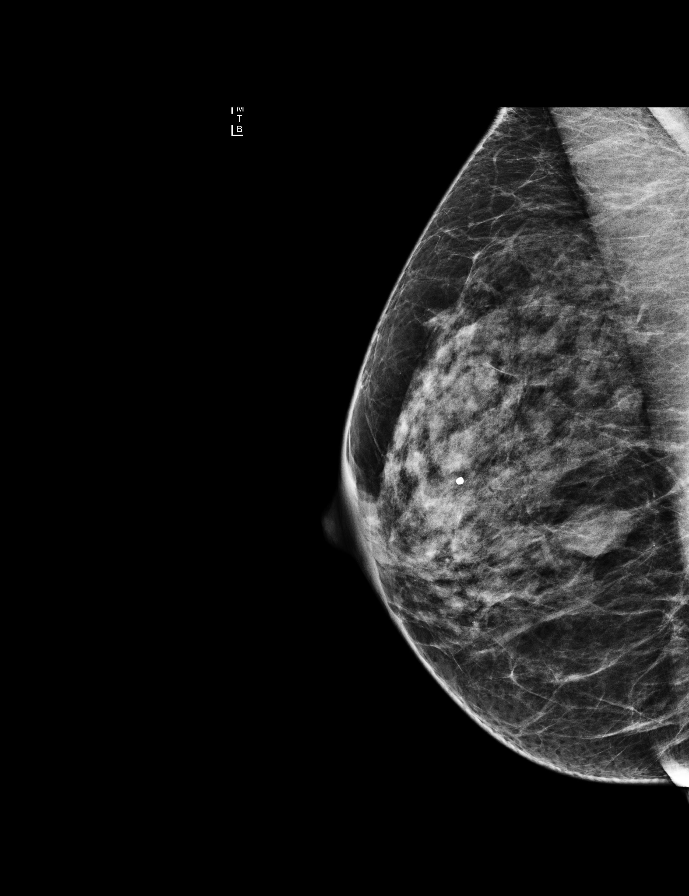

[L MLO tomo · 2 of 51 frames shown]
[frame 17/51]
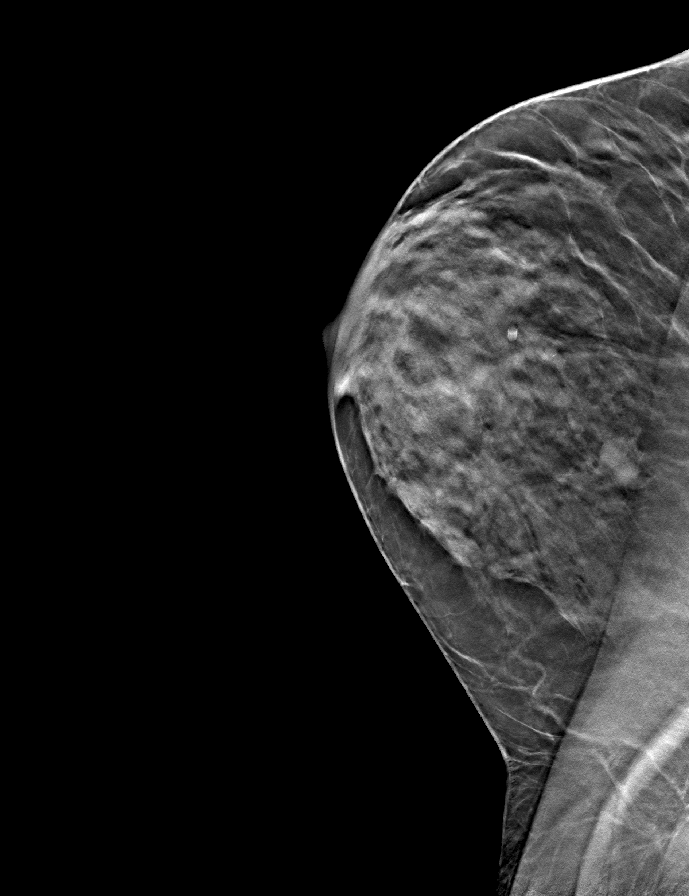
[frame 26/51]
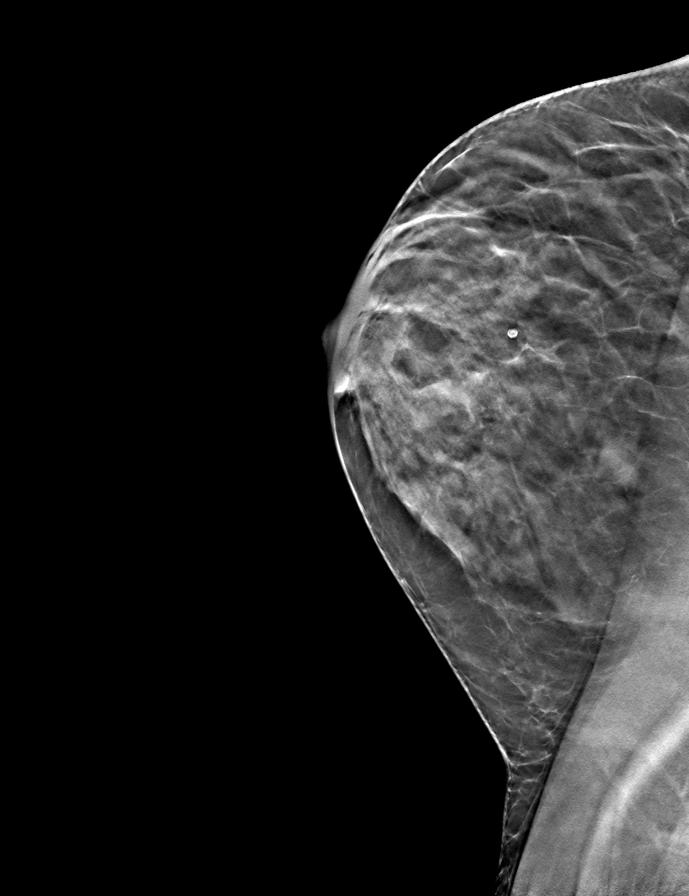

[R CC tomo · tomo slice 26/51.0]
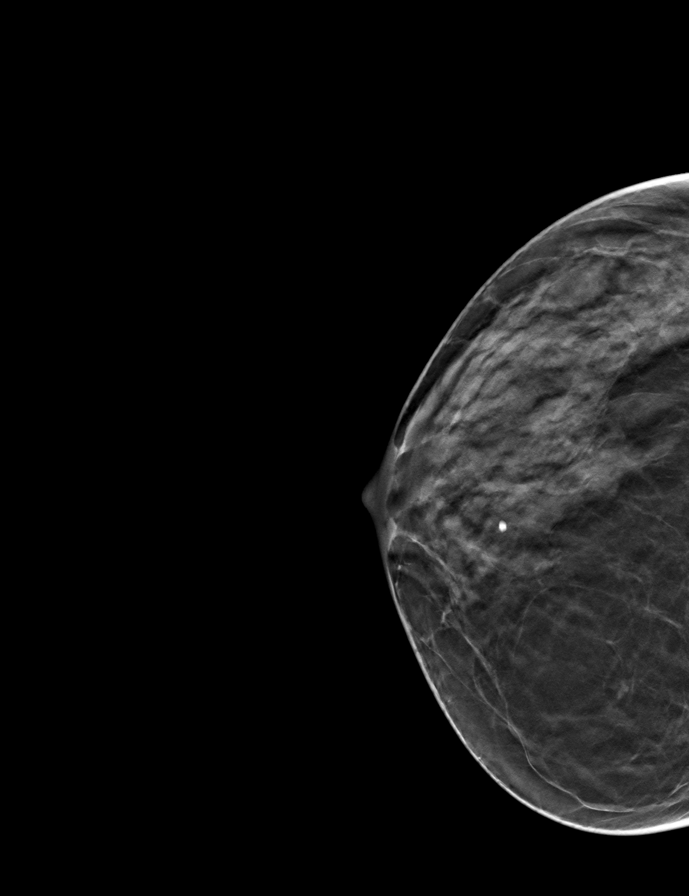

[R MLO tomo · tomo slice 27/52.0]
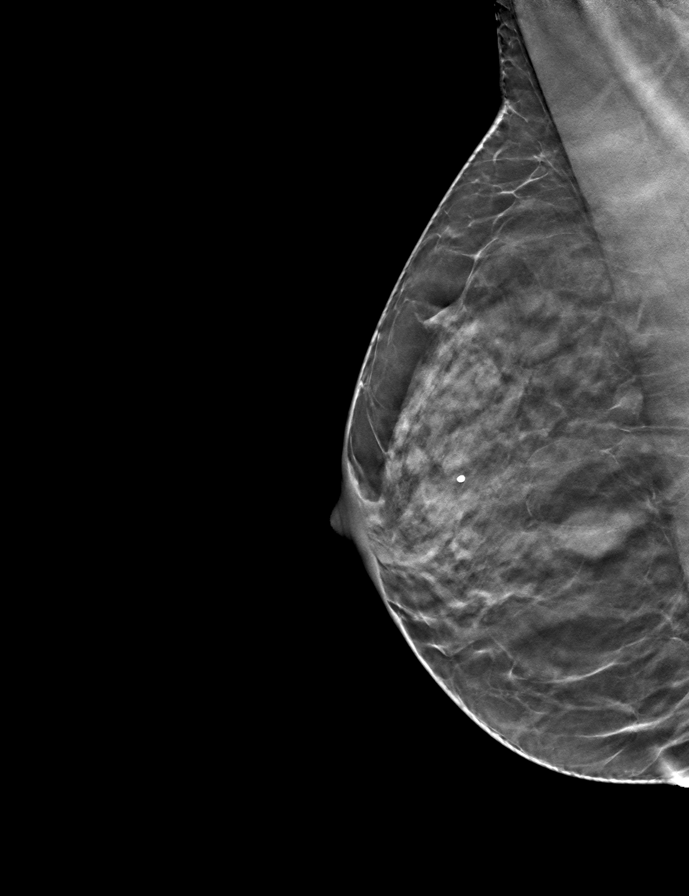

[L CC tomo · tomo slice 27/54.0]
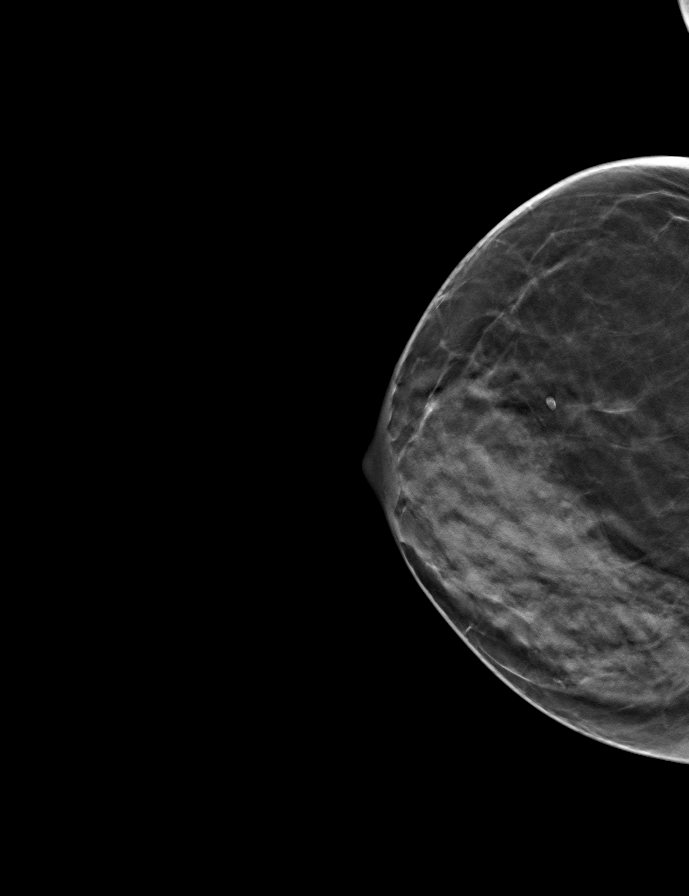

[9 of 24 positions shown; findings below may reference images not displayed]

ACR Breast Density Category c: The breast tissue is heterogeneously
dense, which may obscure small masses.
FINDINGS: In the right breast a possible mass requires further evaluation.

In the left breast a possible mass requires further evaluation.

Images were processed with CAD.
IMPRESSION: Further evaluation is suggested for possible mass in the right
breast.

Further evaluation is suggested for possible mass in the left
breast.

RECOMMENDATION:
On mammogram and possibly ultrasound of both breasts.
(Code:J7-4-88J)

The patient will be contacted regarding the findings, and additional
imaging will be scheduled.

BI-RADS CATEGORY  0: Incomplete. Need additional imaging evaluation
and/or prior mammograms for comparison.

## 2018-12-24 ENCOUNTER — Ambulatory Visit: Payer: Federal, State, Local not specified - PPO | Admitting: Podiatry

## 2018-12-24 ENCOUNTER — Encounter: Payer: Self-pay | Admitting: Podiatry

## 2018-12-24 ENCOUNTER — Ambulatory Visit (INDEPENDENT_AMBULATORY_CARE_PROVIDER_SITE_OTHER): Payer: Federal, State, Local not specified - PPO

## 2018-12-24 ENCOUNTER — Other Ambulatory Visit: Payer: Self-pay

## 2018-12-24 VITALS — BP 115/78 | HR 89 | Resp 16

## 2018-12-24 DIAGNOSIS — M79671 Pain in right foot: Secondary | ICD-10-CM

## 2018-12-24 DIAGNOSIS — M722 Plantar fascial fibromatosis: Secondary | ICD-10-CM | POA: Diagnosis not present

## 2018-12-24 DIAGNOSIS — M79672 Pain in left foot: Secondary | ICD-10-CM | POA: Diagnosis not present

## 2018-12-24 MED ORDER — MELOXICAM 15 MG PO TABS: 15.0000 mg | ORAL_TABLET | Freq: Every day | ORAL | 0 refills | Status: DC

## 2018-12-24 NOTE — Progress Notes (Signed)
Subjective:   Patient ID: Mary Fisher, female   DOB: 53 y.o.   MRN: [id number]   HPI 53 year old female presents the office today for concerns of pain to both heels.  This is been ongoing the left side worse for the last 6 months.  She states that she has pain when she first wakes up after standing all day.  She states that her job she sits for some time and stands back up.  She states her job is burning pain that has improved pain mostly to the bottom of the heel.  She denies any recent injury.  She has tried Tylenol as well as ibuprofen.  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    Review of Systems  All other systems reviewed and are negative.  Past Medical History:  Diagnosis Date  . Asthma   . High cholesterol     Past Surgical History:  Procedure Laterality Date  . SHOULDER SURGERY       Current Outpatient Medications:  .  meloxicam (MOBIC) 15 MG tablet, Take 1 tablet (15 mg total) by mouth daily., Disp: 30 tablet, Rfl: 0 .  rosuvastatin (CRESTOR) 10 MG tablet, Take 10 mg by mouth daily., Disp: , Rfl:  .  zolpidem (AMBIEN) 10 MG tablet, , Disp: , Rfl:   No Known Allergies      Objective:  Physical Exam  General: AAO x3, NAD  Dermatological: Skin is warm, dry and supple bilateral. Nails x 10 are well manicured; remaining integument appears unremarkable at this time. There are no open sores, no preulcerative lesions, no rash or signs of infection present.  Vascular: Dorsalis Pedis artery and Posterior Tibial artery pedal pulses are 2/4 bilateral with immedate capillary fill time. Pedal hair growth present. No varicosities and no lower extremity edema present bilateral. There is no pain with calf compression, swelling, warmth, erythema.   Neruologic: Grossly intact via light touch bilateral.  Negative sign.  Musculoskeletal: Tenderness to palpation along the plantar medial tubercle of the calcaneus at the insertion of plantar fascia on the left and right foot.  There is no pain along the course of the plantar fascia within the arch of the foot. Plantar fascia appears to be intact. There is no pain with lateral compression of the calcaneus or pain with vibratory sensation. There is no pain along the course or insertion of the achilles tendon. No other areas of tenderness to bilateral lower extremities. Muscular strength 5/5 in all groups tested bilateral.  Bunion present left side  Gait: Unassisted, Nonantalgic.        Assessment:   Bilateral plantar fasciitis     Plan:  -Treatment options discussed including all alternatives, risks, and complications -Etiology of symptoms were discussed -X-rays were obtained and reviewed with the patient.  No evidence of acute fracture or stress fracture. -Bilateral steroid injection performed.  See procedure note below. -Plantar fascial braces dispensed x2 -Night splint dispensed x1 -Discussed stretching, icing daily. -Discussed shoe modifications and orthotics  Procedure: Injection Tendon/Ligament Discussed alternatives, risks, complications and verbal consent was obtained.  Location: Bilateral plantar fascia at the glabrous junction; medial approach. Skin Prep: Alcohol. Injectate: 0.5cc 0.5% marcaine plain, 0.5 cc 2% lidocaine plain and, 1 cc kenalog 10. Disposition: Patient tolerated procedure well. Injection site dressed with a band-aid.  Post-injection care was discussed and return precautions discussed.   Return in about 4 weeks (around 01/21/2019).  Trula Slade DPM

## 2018-12-24 NOTE — Patient Instructions (Addendum)

## 2019-01-12 ENCOUNTER — Telehealth: Payer: Self-pay | Admitting: *Deleted

## 2019-01-12 MED ORDER — MELOXICAM 15 MG PO TABS: 15.0000 mg | ORAL_TABLET | Freq: Every day | ORAL | 0 refills | Status: DC

## 2019-01-12 NOTE — Addendum Note (Signed)
Addended by: Cranford Mon R on: 01/12/2019 09:16 AM   Modules accepted: Orders

## 2019-01-12 NOTE — Telephone Encounter (Signed)
Called and spoke with the patient and patient would like a 90 day RX of meloxicam and per Dr Jacqualyn Posey was ok to refill the 90 days and sent a refill to the pharmacy today. Lattie Haw

## 2019-01-13 ENCOUNTER — Ambulatory Visit: Payer: Federal, State, Local not specified - PPO | Attending: Internal Medicine

## 2019-01-13 DIAGNOSIS — U071 COVID-19: Secondary | ICD-10-CM

## 2019-01-14 LAB — NOVEL CORONAVIRUS, NAA: SARS-CoV-2, NAA: NOT DETECTED

## 2019-01-21 ENCOUNTER — Ambulatory Visit: Payer: Federal, State, Local not specified - PPO | Admitting: Podiatry

## 2019-01-22 ENCOUNTER — Other Ambulatory Visit: Payer: Self-pay

## 2019-01-22 ENCOUNTER — Encounter (HOSPITAL_BASED_OUTPATIENT_CLINIC_OR_DEPARTMENT_OTHER): Payer: Self-pay | Admitting: *Deleted

## 2019-01-22 ENCOUNTER — Other Ambulatory Visit: Payer: Federal, State, Local not specified - PPO

## 2019-01-22 ENCOUNTER — Ambulatory Visit: Payer: Federal, State, Local not specified - PPO

## 2019-01-22 DIAGNOSIS — R1032 Left lower quadrant pain: Secondary | ICD-10-CM | POA: Diagnosis present

## 2019-01-22 DIAGNOSIS — E876 Hypokalemia: Secondary | ICD-10-CM | POA: Insufficient documentation

## 2019-01-22 DIAGNOSIS — Z20822 Contact with and (suspected) exposure to covid-19: Secondary | ICD-10-CM

## 2019-01-22 DIAGNOSIS — K5732 Diverticulitis of large intestine without perforation or abscess without bleeding: Secondary | ICD-10-CM | POA: Insufficient documentation

## 2019-01-22 LAB — BASIC METABOLIC PANEL
Anion gap: 13 (ref 5–15)
BUN: <5 mg/dL — ABNORMAL LOW (ref 6–20)
CO2: 27 mmol/L (ref 22–32)
Calcium: 8.9 mg/dL (ref 8.9–10.3)
Chloride: 97 mmol/L — ABNORMAL LOW (ref 98–111)
Creatinine, Ser: 0.67 mg/dL (ref 0.44–1.00)
GFR calc Af Amer: >60 mL/min (ref >60)
GFR calc non Af Amer: >60 mL/min (ref >60)
Glucose, Bld: 112 mg/dL — ABNORMAL HIGH (ref 70–99)
Potassium: 2.9 mmol/L — ABNORMAL LOW (ref 3.5–5.1)
Sodium: 137 mmol/L (ref 135–145)

## 2019-01-22 LAB — CBC WITH DIFFERENTIAL/PLATELET
Abs Immature Granulocytes: 0.02 10*3/uL (ref 0.00–0.07)
Basophils Absolute: 0.1 10*3/uL (ref 0.0–0.1)
Basophils Relative: 1 %
Eosinophils Absolute: 0.1 10*3/uL (ref 0.0–0.5)
Eosinophils Relative: 1 %
HCT: 41.1 % (ref 36.0–46.0)
Hemoglobin: 14 g/dL (ref 12.0–15.0)
Immature Granulocytes: 0 %
Lymphocytes Relative: 31 %
Lymphs Abs: 1.9 10*3/uL (ref 0.7–4.0)
MCH: 32.3 pg (ref 26.0–34.0)
MCHC: 34.1 g/dL (ref 30.0–36.0)
MCV: 94.7 fL (ref 80.0–100.0)
Monocytes Absolute: 0.6 10*3/uL (ref 0.1–1.0)
Monocytes Relative: 9 %
Neutro Abs: 3.6 10*3/uL (ref 1.7–7.7)
Neutrophils Relative %: 58 %
Platelets: 407 10*3/uL — ABNORMAL HIGH (ref 150–400)
RBC: 4.34 MIL/uL (ref 3.87–5.11)
RDW: 12.3 % (ref 11.5–15.5)
WBC: 6.3 10*3/uL (ref 4.0–10.5)
nRBC: 0 % (ref 0.0–0.2)

## 2019-01-22 LAB — LIPASE, BLOOD: Lipase: 34 U/L (ref 11–51)

## 2019-01-22 LAB — URINALYSIS, ROUTINE W REFLEX MICROSCOPIC
Bilirubin Urine: NEGATIVE
Glucose, UA: NEGATIVE mg/dL
Ketones, ur: NEGATIVE mg/dL
Leukocytes,Ua: NEGATIVE
Nitrite: NEGATIVE
Protein, ur: NEGATIVE mg/dL
Specific Gravity, Urine: 1.01 (ref 1.005–1.030)
pH: 7 (ref 5.0–8.0)

## 2019-01-22 LAB — URINALYSIS, MICROSCOPIC (REFLEX)

## 2019-01-22 LAB — TROPONIN I (HIGH SENSITIVITY): Troponin I (High Sensitivity): 3 ng/L (ref <18)

## 2019-01-22 NOTE — ED Triage Notes (Signed)
Pt has abdominal pain. She is being treated for diverticulitis. She thinks she is having a reaction to the Flagyl. She had a negative Covid test this week and had a second Covid test today that has not resulted. She has SOB, cough, and chest pain described as pressure.

## 2019-01-23 ENCOUNTER — Emergency Department (HOSPITAL_BASED_OUTPATIENT_CLINIC_OR_DEPARTMENT_OTHER): Payer: Federal, State, Local not specified - PPO

## 2019-01-23 ENCOUNTER — Emergency Department (HOSPITAL_BASED_OUTPATIENT_CLINIC_OR_DEPARTMENT_OTHER)
Admission: EM | Admit: 2019-01-23 | Discharge: 2019-01-23 | Disposition: A | Discharge status: Home / Self Care | Payer: Federal, State, Local not specified - PPO | Attending: Emergency Medicine | Admitting: Emergency Medicine

## 2019-01-23 DIAGNOSIS — K5792 Diverticulitis of intestine, part unspecified, without perforation or abscess without bleeding: Secondary | ICD-10-CM

## 2019-01-23 DIAGNOSIS — E876 Hypokalemia: Secondary | ICD-10-CM

## 2019-01-23 HISTORY — DX: Diverticulitis of intestine, part unspecified, without perforation or abscess without bleeding: K57.92

## 2019-01-23 LAB — HEPATIC FUNCTION PANEL
ALT: 19 U/L (ref 0–44)
AST: 27 U/L (ref 15–41)
Albumin: 4 g/dL (ref 3.5–5.0)
Alkaline Phosphatase: 44 U/L (ref 38–126)
Bilirubin, Direct: 0.1 mg/dL (ref 0.0–0.2)
Indirect Bilirubin: 0.4 mg/dL (ref 0.3–0.9)
Total Bilirubin: 0.5 mg/dL (ref 0.3–1.2)
Total Protein: 7.5 g/dL (ref 6.5–8.1)

## 2019-01-23 LAB — TROPONIN I (HIGH SENSITIVITY): Troponin I (High Sensitivity): 3 ng/L (ref <18)

## 2019-01-23 MED ORDER — FENTANYL CITRATE (PF) 100 MCG/2ML IJ SOLN
50.0000 ug | Freq: Once | INTRAMUSCULAR | Status: AC
  Administered 2019-01-23: 50 ug via INTRAVENOUS
  Filled 2019-01-23: qty 2

## 2019-01-23 MED ORDER — POTASSIUM CHLORIDE 10 MEQ/100ML IV SOLN
10.0000 meq | Freq: Once | INTRAVENOUS | Status: AC
  Administered 2019-01-23: 10 meq via INTRAVENOUS
  Filled 2019-01-23: qty 100

## 2019-01-23 MED ORDER — POTASSIUM CHLORIDE CRYS ER 20 MEQ PO TBCR
40.0000 meq | EXTENDED_RELEASE_TABLET | Freq: Once | ORAL | Status: AC
  Administered 2019-01-23: 40 meq via ORAL
  Filled 2019-01-23: qty 2

## 2019-01-23 MED ORDER — ONDANSETRON HCL 4 MG/2ML IJ SOLN
4.0000 mg | Freq: Once | INTRAMUSCULAR | Status: AC
  Administered 2019-01-23: 4 mg via INTRAVENOUS
  Filled 2019-01-23: qty 2

## 2019-01-23 MED ORDER — LACTATED RINGERS IV BOLUS
1000.0000 mL | Freq: Once | INTRAVENOUS | Status: AC
  Administered 2019-01-23: 1000 mL via INTRAVENOUS

## 2019-01-23 MED ORDER — PROMETHAZINE HCL 25 MG PO TABS: 25.0000 mg | ORAL_TABLET | Freq: Four times a day (QID) | ORAL | 0 refills | Status: DC | PRN

## 2019-01-23 MED ORDER — POTASSIUM CHLORIDE CRYS ER 20 MEQ PO TBCR: 40.0000 meq | EXTENDED_RELEASE_TABLET | Freq: Two times a day (BID) | ORAL | 0 refills | Status: DC

## 2019-01-23 MED ORDER — MAGNESIUM SULFATE 2 GM/50ML IV SOLN
2.0000 g | Freq: Once | INTRAVENOUS | Status: AC
  Administered 2019-01-23: 2 g via INTRAVENOUS
  Filled 2019-01-23: qty 50

## 2019-01-23 MED ORDER — IOHEXOL 300 MG/ML  SOLN
100.0000 mL | Freq: Once | INTRAMUSCULAR | Status: AC | PRN
  Administered 2019-01-23: 100 mL via INTRAVENOUS

## 2019-01-23 MED ORDER — AMOXICILLIN-POT CLAVULANATE 875-125 MG PO TABS: 1.0000 | ORAL_TABLET | Freq: Two times a day (BID) | ORAL | 0 refills | Status: DC

## 2019-01-23 MED ORDER — SODIUM CHLORIDE 0.9 % IV SOLN
1.5000 g | Freq: Once | INTRAVENOUS | Status: AC
  Administered 2019-01-23: 1.5 g via INTRAVENOUS
  Filled 2019-01-23: qty 4

## 2019-01-23 NOTE — ED Provider Notes (Signed)
Emergency Department Provider Note   I have reviewed the triage vital signs and the nursing notes.   HISTORY  Chief Complaint Abdominal Pain, Chest Pain, and Shortness of Breath   HPI Mary Fisher is a 54 y.o. female who presents to the emergency department today with ongoing abdominal pain.  Patient states she has history of diverticulitis and she saw her physician via E visit last Thursday.  She states that she is started on ciprofloxacin and Flagyl at that time and over the next few days she had slow improvement in her symptoms.  On Tuesday or Wednesday her symptoms started to get worse again with nausea and left lower quadrant abdominal pain.  She has some suprapubic burning as well.  No urinary symptoms.  No back pain.  She also states that approximately 2030 minutes after she takes the Flagyl she gets burning in her mouth and tongue and feels short of breath and this slowly subsides over a couple hours.  She is asymptomatic from that standpoint this time. No cough or fever.   No other associated or modifying symptoms.    Past Medical History:  Diagnosis Date  . Asthma   . Diverticulitis   . High cholesterol     Patient Active Problem List   Diagnosis Date Noted  . Neuropathic pain of left flank 11/01/2015    Past Surgical History:  Procedure Laterality Date  . SHOULDER SURGERY      Current Outpatient Rx  . Order #: [id number] Class: Normal  . Order #: [id number] Class: Historical Med  . Order #: [id number] Class: Historical Med  . Order #: [id number] Class: Normal  . Order #: [id number] Class: Normal  . Order #: [id number] Class: Normal    Allergies Patient has no known allergies.  Family History  Problem Relation Age of Onset  . Heart disease Mother   . Heart disease Father     Social History Social History   Tobacco Use  . Smoking status: Never Smoker  . Smokeless tobacco: Never Used  Substance Use Topics  . Alcohol use: No  . Drug use: No    Review  of Systems  All other systems negative except as documented in the HPI. All pertinent positives and negatives as reviewed in the HPI. ____________________________________________   PHYSICAL EXAM:  VITAL SIGNS: ED Triage Vitals  Enc Vitals Group     BP 01/22/19 2150 (!) 148/80     Pulse Rate 01/22/19 2150 (!) 101     Resp 01/22/19 2150 20     Temp 01/22/19 2150 98.8 F (37.1 C)     Temp Source 01/22/19 2150 Oral     SpO2 01/22/19 2150 100 %     Weight 01/22/19 2150 108 lb (49 kg)     Height 01/22/19 2150 5\' 1"  (1.549 m)    Constitutional: Alert and oriented. Well appearing and in no acute distress. Eyes: Conjunctivae are normal. PERRL. EOMI. Head: Atraumatic. Nose: No congestion/rhinnorhea. Mouth/Throat: Mucous membranes are moist.  Oropharynx non-erythematous. Neck: No stridor.  No meningeal signs.   Cardiovascular: Normal rate, regular rhythm. Good peripheral circulation. Grossly normal heart sounds.   Respiratory: Normal respiratory effort.  No retractions. Lungs CTAB. Gastrointestinal: Soft and mild llq ttp. No distention.  Musculoskeletal: No lower extremity tenderness nor edema. No gross deformities of extremities. Neurologic:  Normal speech and language. No gross focal neurologic deficits are appreciated.  Skin:  Skin is warm, dry and intact. No rash noted.  ____________________________________________   LABS (all labs ordered are  listed, but only abnormal results are displayed)  Labs Reviewed  CBC WITH DIFFERENTIAL/PLATELET - Abnormal; Notable for the following components:      Result Value   Platelets 407 (*)    All other components within normal limits  BASIC METABOLIC PANEL - Abnormal; Notable for the following components:   Potassium 2.9 (*)    Chloride 97 (*)    Glucose, Bld 112 (*)    BUN <5 (*)    All other components within normal limits  URINALYSIS, ROUTINE W REFLEX MICROSCOPIC - Abnormal; Notable for the following components:   Hgb urine dipstick  TRACE (*)    All other components within normal limits  URINALYSIS, MICROSCOPIC (REFLEX) - Abnormal; Notable for the following components:   Bacteria, UA RARE (*)    All other components within normal limits  LIPASE, BLOOD  HEPATIC FUNCTION PANEL  TROPONIN I (HIGH SENSITIVITY)  TROPONIN I (HIGH SENSITIVITY)   ____________________________________________  EKG   EKG Interpretation  Date/Time:    Ventricular Rate:    PR Interval:    QRS Duration:   QT Interval:    QTC Calculation:   R Axis:     Text Interpretation:         ____________________________________________  RADIOLOGY  CT ABDOMEN PELVIS W CONTRAST  Result Date: 01/23/2019 CLINICAL DATA:  54 year old female with left-sided abdominal pain. Concern for abscess or infection. EXAM: CT ABDOMEN AND PELVIS WITH CONTRAST TECHNIQUE: Multidetector CT imaging of the abdomen and pelvis was performed using the standard protocol following bolus administration of intravenous contrast. CONTRAST:  OMNIPAQUE IOHEXOL 300 MG/ML  SOLN COMPARISON:  CT abdomen pelvis dated 03/21/2017. FINDINGS: Lower chest: The visualized lung bases are clear. No intra-abdominal free air or free fluid. Hepatobiliary: The liver is unremarkable. Mild intrahepatic biliary ductal dilatation versus mild periportal edema. No calcified gallstone or pericholecystic fluid. Pancreas: Unremarkable. No pancreatic ductal dilatation or surrounding inflammatory changes. Spleen: Normal in size without focal abnormality. Adrenals/Urinary Tract: The adrenal glands are unremarkable. There is a 5 mm nonobstructing left renal interpolar calculus. No hydronephrosis. There is symmetric enhancement and excretion of contrast by both kidneys. The visualized ureters and urinary bladder appear unremarkable. Stomach/Bowel: There is sigmoid diverticulosis. Mild inflammatory changes of the sigmoid colon (coronal series 5 images 32-38) consistent with mild acute diverticulitis. No  diverticular abscess or perforation. There is no bowel obstruction. The appendix is normal. Vascular/Lymphatic: The abdominal aorta and IVC are unremarkable. No portal venous gas. There is no adenopathy. Reproductive: The uterus is grossly unremarkable. No adnexal masses. Other: None Musculoskeletal: No acute or significant osseous findings. IMPRESSION: 1. Mild sigmoid diverticulitis. No diverticular abscess or perforation. 2. Mild intrahepatic biliary ductal dilatation versus mild periportal edema. Correlation with liver function tests recommended. 3. A 5 mm nonobstructing left renal interpolar calculus. No hydronephrosis. 4. No bowel obstruction.  Normal appendix. Electronically Signed   By: Elgie Collard M.D.   On: 01/23/2019 03:39   DG Chest Portable 1 View  Result Date: 01/23/2019 CLINICAL DATA:  Chest pain EXAM: PORTABLE CHEST 1 VIEW COMPARISON:  February 18, 2011 FINDINGS: The heart size and mediastinal contours are within normal limits. Both lungs are clear. The visualized skeletal structures are unremarkable. IMPRESSION: No active disease. Electronically Signed   By: Jonna Clark M.D.   On: 01/23/2019 01:39    ____________________________________________   PROCEDURES  Procedure(s) performed:   Procedures   ____________________________________________   INITIAL IMPRESSION / ASSESSMENT AND PLAN / ED COURSE  Still with uncomplicated diverticulitis  and likely SE's from flagyl. Will switch to augmentin. Low K, repletion started here, likely from decreased PO, will continue repleting at home, pcp follow up.  Pertinent labs & imaging results that were available during my care of the patient were reviewed by me and considered in my medical decision making (see chart for details).  A medical screening exam was performed and I feel the patient has had an appropriate workup for their chief complaint at this time and likelihood of emergent condition existing is low. They have been counseled  on decision, discharge, follow up and which symptoms necessitate immediate return to the emergency department. They or their family verbally stated understanding and agreement with plan and discharged in stable condition.   ____________________________________________  FINAL CLINICAL IMPRESSION(S) / ED DIAGNOSES  Final diagnoses:  Diverticulitis  Hypokalemia     MEDICATIONS GIVEN DURING THIS VISIT:  Medications  potassium chloride 10 mEq in 100 mL IVPB (10 mEq Intravenous New Bag/Given 01/23/19 0521)  magnesium sulfate IVPB 2 g 50 mL (2 g Intravenous New Bag/Given 01/23/19 0525)  lactated ringers bolus 1,000 mL (0 mLs Intravenous Stopped 01/23/19 0230)  fentaNYL (SUBLIMAZE) injection 50 mcg (50 mcg Intravenous Given 01/23/19 0128)  ondansetron (ZOFRAN) injection 4 mg (4 mg Intravenous Given 01/23/19 0128)  iohexol (OMNIPAQUE) 300 MG/ML solution 100 mL (100 mLs Intravenous Contrast Given 01/23/19 0315)  ampicillin-sulbactam (UNASYN) 1.5 g in sodium chloride 0.9 % 100 mL IVPB (1.5 g Intravenous New Bag/Given 01/23/19 0412)  potassium chloride SA (KLOR-CON) CR tablet 40 mEq (40 mEq Oral Given 01/23/19 0413)     NEW OUTPATIENT MEDICATIONS STARTED DURING THIS VISIT:  New Prescriptions   AMOXICILLIN-CLAVULANATE (AUGMENTIN) 875-125 MG TABLET    Take 1 tablet by mouth 2 (two) times daily. One po bid x 7 days   POTASSIUM CHLORIDE SA (KLOR-CON) 20 MEQ TABLET    Take 2 tablets (40 mEq total) by mouth 2 (two) times daily for 7 days.   PROMETHAZINE (PHENERGAN) 25 MG TABLET    Take 1 tablet (25 mg total) by mouth every 6 (six) hours as needed for nausea or vomiting.    Note:  This note was prepared with assistance of Dragon voice recognition software. Occasional wrong-word or sound-a-like substitutions may have occurred due to the inherent limitations of voice recognition software.   Zeph Riebel, Corene Cornea, MD 01/23/19 [phone]

## 2019-01-24 LAB — NOVEL CORONAVIRUS, NAA: SARS-CoV-2, NAA: NOT DETECTED

## 2019-02-08 ENCOUNTER — Ambulatory Visit: Payer: Federal, State, Local not specified - PPO | Admitting: Podiatry

## 2019-02-11 ENCOUNTER — Ambulatory Visit: Payer: Federal, State, Local not specified - PPO | Admitting: Podiatry

## 2019-02-11 ENCOUNTER — Other Ambulatory Visit: Payer: Self-pay

## 2019-02-11 DIAGNOSIS — M722 Plantar fascial fibromatosis: Secondary | ICD-10-CM | POA: Insufficient documentation

## 2019-02-11 NOTE — Patient Instructions (Signed)

## 2019-02-11 NOTE — Progress Notes (Signed)
Subjective: 54 year old female presents the office today for Evaluation of bilateral heel pain, plan fasciitis with the left side worse than the right.  She states that she was feeling better and the injections were helpful.  She has stopped the anti-inflammatory due to diverticulitis.  She has been using the brace.  She does try to stretch as much as possible. Denies any systemic complaints such as fevers, chills, nausea, vomiting. No acute changes since last appointment, and no other complaints at this time.   Objective: AAO x3, NAD DP/PT pulses palpable bilaterally, CRT less than 3 seconds There is improved but yet continued tenderness to palpation along the plantar medial tubercle of the calcaneus at the insertion of plantar fascia on the left > right foot. There is no pain along the course of the plantar fascia within the arch of the foot. Plantar fascia appears to be intact. There is no pain with lateral compression of the calcaneus or pain with vibratory sensation. There is no pain along the course or insertion of the achilles tendon. No other areas of tenderness to bilateral lower extremities. Negative tinel sign No open lesions or pre-ulcerative lesions.  No pain with calf compression, swelling, warmth, erythema  Assessment: 54 year old female bilateral plantar fasciitis left worse than right  Plan: -All treatment options discussed with the patient including all alternatives, risks, complications.  -Second steroid injection performed today.  See procedure note below.  Continue stretching, icing daily.  Continue with night splint on a more regular basis.  She did get new shoes as well. -Once cleared by her GI doctor she can start to use the anti-inflammatories. -Patient encouraged to call the office with any questions, concerns, change in symptoms.   Procedure: Injection Tendon/Ligament Discussed alternatives, risks, complications and verbal consent was obtained.  Location: Left plantar  fascia at the glabrous junction; medial approach. Skin Prep: Alcohol. Injectate: 0.5cc 0.5% marcaine plain, 0.5 cc 2% lidocaine plain and, 1 cc kenalog 10. Disposition: Patient tolerated procedure well. Injection site dressed with a band-aid.  Post-injection care was discussed and return precautions discussed.   Return in about 4 weeks (around 03/11/2019) for plantar fasciitis .  Mary Fisher DPM

## 2019-03-11 ENCOUNTER — Ambulatory Visit: Payer: Federal, State, Local not specified - PPO | Admitting: Podiatry

## 2019-03-30 ENCOUNTER — Other Ambulatory Visit: Payer: Self-pay | Admitting: Gastroenterology

## 2019-03-30 DIAGNOSIS — Z8719 Personal history of other diseases of the digestive system: Secondary | ICD-10-CM

## 2019-03-30 DIAGNOSIS — R1084 Generalized abdominal pain: Secondary | ICD-10-CM

## 2019-04-13 ENCOUNTER — Other Ambulatory Visit: Payer: Federal, State, Local not specified - PPO

## 2019-04-28 ENCOUNTER — Other Ambulatory Visit: Payer: Federal, State, Local not specified - PPO

## 2019-04-29 ENCOUNTER — Other Ambulatory Visit: Payer: Self-pay | Admitting: Obstetrics & Gynecology

## 2019-04-29 DIAGNOSIS — R928 Other abnormal and inconclusive findings on diagnostic imaging of breast: Secondary | ICD-10-CM

## 2019-05-04 ENCOUNTER — Other Ambulatory Visit: Payer: Self-pay

## 2019-05-04 ENCOUNTER — Ambulatory Visit
Admission: RE | Admit: 2019-05-04 | Discharge: 2019-05-04 | Disposition: A | Discharge status: Home / Self Care | Payer: Federal, State, Local not specified - PPO | Source: Ambulatory Visit | Attending: Obstetrics & Gynecology | Admitting: Obstetrics & Gynecology

## 2019-05-04 DIAGNOSIS — R928 Other abnormal and inconclusive findings on diagnostic imaging of breast: Secondary | ICD-10-CM

## 2019-05-25 ENCOUNTER — Ambulatory Visit: Payer: Federal, State, Local not specified - PPO | Attending: Internal Medicine

## 2019-05-25 DIAGNOSIS — Z23 Encounter for immunization: Secondary | ICD-10-CM

## 2019-05-25 NOTE — Progress Notes (Signed)
   Covid-19 Vaccination Clinic  Name:  Mary Fisher    MRN: [id number] DOB: 1965-09-23  05/25/2019  Mary Fisher was observed post Covid-19 immunization for 30 minutes based on pre-vaccination screening without incident. She was provided with Vaccine Information Sheet and instruction to access the V-Safe system.   Mary Fisher was instructed to call 911 with any severe reactions post vaccine: Marland Kitchen Difficulty breathing  . Swelling of face and throat  . A fast heartbeat  . A bad rash all over body  . Dizziness and weakness   Immunizations Administered    Name Date Dose VIS Date Route   Pfizer COVID-19 Vaccine 05/25/2019  9:11 AM 0.3 mL 03/10/2018 Intramuscular   Manufacturer: [employer], [employer]   Lot: [id number]   NDC: [address]-6

## 2019-06-15 ENCOUNTER — Ambulatory Visit: Payer: Federal, State, Local not specified - PPO

## 2019-06-28 ENCOUNTER — Ambulatory Visit: Payer: Federal, State, Local not specified - PPO | Attending: Internal Medicine

## 2019-08-30 ENCOUNTER — Encounter: Payer: Self-pay | Admitting: Podiatry

## 2019-08-30 ENCOUNTER — Other Ambulatory Visit: Payer: Self-pay

## 2019-08-30 ENCOUNTER — Ambulatory Visit: Payer: Federal, State, Local not specified - PPO | Admitting: Podiatry

## 2019-08-30 DIAGNOSIS — M722 Plantar fascial fibromatosis: Secondary | ICD-10-CM

## 2019-08-30 MED ORDER — DICLOFENAC SODIUM 1 % EX GEL: 2.0000 g | Freq: Four times a day (QID) | CUTANEOUS | 2 refills | Status: DC

## 2019-08-30 NOTE — Patient Instructions (Signed)

## 2019-09-06 NOTE — Progress Notes (Signed)
Subjective: 54 year old female presents the office for follow-up evaluation of bilateral heel pain of the left side worse than the right, plantar fasciitis.  She states that she was doing better the last couple months the pain is worse and she is requesting steroid injections today.  She denies recent injury or trauma or any changes otherwise.  No rating pain or weakness. Denies any systemic complaints such as fevers, chills, nausea, vomiting. No acute changes since last appointment, and no other complaints at this time.   Objective: AAO x3, NAD DP/PT pulses palpable bilaterally, CRT less than 3 seconds There is tenderness palpation at the tibial tubercle of the calcaneus insertion of plantar fascial bilaterally with left side worse than the right.  There is no pain about compression of calcaneus.  No pain with Achilles tendon.  Negative L-spine.  No other areas of discomfort.  MMT 5/5. No pain with calf compression, swelling, warmth, erythema  Assessment: Bilateral plantar fasciitis left side worse than right  Plan: -All treatment options discussed with the patient including all alternatives, risks, complications.  -Steroid injection performed.  See procedure note below. -Plan fascial strapping was applied -Measure for orthotics -Voltaren gel -Continue stretching, icing. -Patient encouraged to call the office with any questions, concerns, change in symptoms.   Procedure: Injection Tendon/Ligament Discussed alternatives, risks, complications and verbal consent was obtained.  Location: Bilateral plantar fascia at the glabrous junction; medial approach. Skin Prep: Alcohol  Injectate: 0.5cc 0.5% marcaine plain, 0.5 cc 2% lidocaine plain and, 1 cc kenalog 10. Disposition: Patient tolerated procedure well. Injection site dressed with a band-aid.  Post-injection care was discussed and return precautions discussed.   Return in about 4 weeks (around 09/27/2019).  Vivi Barrack DPM

## 2019-09-16 ENCOUNTER — Other Ambulatory Visit: Payer: Self-pay | Admitting: Gastroenterology

## 2019-09-16 DIAGNOSIS — Z8719 Personal history of other diseases of the digestive system: Secondary | ICD-10-CM

## 2019-09-16 DIAGNOSIS — R1084 Generalized abdominal pain: Secondary | ICD-10-CM

## 2019-09-27 ENCOUNTER — Other Ambulatory Visit: Payer: Federal, State, Local not specified - PPO

## 2019-09-30 ENCOUNTER — Other Ambulatory Visit: Payer: Self-pay

## 2019-09-30 ENCOUNTER — Ambulatory Visit (INDEPENDENT_AMBULATORY_CARE_PROVIDER_SITE_OTHER): Payer: Federal, State, Local not specified - PPO | Admitting: Podiatry

## 2019-09-30 DIAGNOSIS — M722 Plantar fascial fibromatosis: Secondary | ICD-10-CM

## 2019-09-30 DIAGNOSIS — M79672 Pain in left foot: Secondary | ICD-10-CM | POA: Diagnosis not present

## 2019-09-30 DIAGNOSIS — M79671 Pain in right foot: Secondary | ICD-10-CM

## 2019-10-01 ENCOUNTER — Ambulatory Visit
Admission: RE | Admit: 2019-10-01 | Discharge: 2019-10-01 | Disposition: A | Discharge status: Home / Self Care | Payer: Federal, State, Local not specified - PPO | Source: Ambulatory Visit | Attending: Gastroenterology | Admitting: Gastroenterology

## 2019-10-01 ENCOUNTER — Other Ambulatory Visit: Payer: Federal, State, Local not specified - PPO

## 2019-10-01 DIAGNOSIS — R1084 Generalized abdominal pain: Secondary | ICD-10-CM

## 2019-10-01 DIAGNOSIS — Z8719 Personal history of other diseases of the digestive system: Secondary | ICD-10-CM

## 2019-10-01 MED ORDER — IOPAMIDOL (ISOVUE-300) INJECTION 61%
100.0000 mL | Freq: Once | INTRAVENOUS | Status: AC | PRN
  Administered 2019-10-01: 100 mL via INTRAVENOUS

## 2019-10-03 NOTE — Progress Notes (Signed)
Subjective: 54 year old female presents the office for follow-up evaluation of bilateral heel pain of the left side worse than the right, plantar fasciitis.  Overall she states that she is to have the same injections were not very helpful.  She presents to pick up orthotics.  No new concerns otherwise.  Pain fluctuates.  She states the taping helped the most.  No acute changes since last appointment, and no other complaints at this time.   Objective: AAO x3, NAD DP/PT pulses palpable bilaterally, CRT less than 3 seconds There is continuation of tenderness palpation at the tibial tubercle of the calcaneus insertion of plantar fascial bilaterally with left side worse than the right. There is no pain about compression of calcaneus.  No pain with Achilles tendon.  Negative Tinel sign.  No other areas of discomfort.  MMT 5/5. No pain with calf compression, swelling, warmth, erythema  Assessment: Bilateral plantar fasciitis left side worse than right  Plan: -All treatment options discussed with the patient including all alternatives, risks, complications.  -I did discuss orthotics today particularly refitting the arch well.  I dispensed a pair of power steps in the interim which she states felt good.  I will send the custom products back to increase the arch.  Continue stretching, icing daily.  Discussed shoe modifications as well.  We held off another injection today as it was not helpful.  If no improvement MRI.   -Steroid injection performed.  See procedure note below. -Plan fascial strapping was applied -Measure for orthotics -Voltaren gel -Continue stretching, icing. -Patient encouraged to call the office with any questions, concerns, change in symptoms.   Vivi Barrack DPM

## 2021-04-11 DIAGNOSIS — F419 Anxiety disorder, unspecified: Secondary | ICD-10-CM | POA: Diagnosis not present

## 2021-04-11 DIAGNOSIS — J45909 Unspecified asthma, uncomplicated: Secondary | ICD-10-CM | POA: Diagnosis not present

## 2021-04-11 DIAGNOSIS — E7800 Pure hypercholesterolemia, unspecified: Secondary | ICD-10-CM | POA: Diagnosis not present

## 2021-04-11 DIAGNOSIS — G4700 Insomnia, unspecified: Secondary | ICD-10-CM | POA: Diagnosis not present

## 2021-05-07 DIAGNOSIS — X32XXXS Exposure to sunlight, sequela: Secondary | ICD-10-CM | POA: Diagnosis not present

## 2021-05-07 DIAGNOSIS — L821 Other seborrheic keratosis: Secondary | ICD-10-CM | POA: Diagnosis not present

## 2021-05-07 DIAGNOSIS — D229 Melanocytic nevi, unspecified: Secondary | ICD-10-CM | POA: Diagnosis not present

## 2021-05-07 DIAGNOSIS — L814 Other melanin hyperpigmentation: Secondary | ICD-10-CM | POA: Diagnosis not present

## 2021-06-22 DIAGNOSIS — F411 Generalized anxiety disorder: Secondary | ICD-10-CM | POA: Diagnosis not present

## 2021-06-22 DIAGNOSIS — E782 Mixed hyperlipidemia: Secondary | ICD-10-CM | POA: Diagnosis not present

## 2021-06-22 DIAGNOSIS — Z8249 Family history of ischemic heart disease and other diseases of the circulatory system: Secondary | ICD-10-CM | POA: Diagnosis not present

## 2021-06-22 DIAGNOSIS — G4700 Insomnia, unspecified: Secondary | ICD-10-CM | POA: Diagnosis not present

## 2021-06-25 ENCOUNTER — Telehealth: Payer: Self-pay

## 2021-06-25 NOTE — Telephone Encounter (Signed)
NOTES SCANNED TO REFERRAL 

## 2021-07-25 DIAGNOSIS — E782 Mixed hyperlipidemia: Secondary | ICD-10-CM | POA: Diagnosis not present

## 2021-07-31 NOTE — Progress Notes (Signed)
Cardiology Office Note:    Date:  08/02/2021   ID:  Mary Fisher, DOB [birth year], MRN [id number]  PCP:  Elias Else, MD  Cardiologist:  Lesleigh Noe, MD   Referring MD: Mary Fisher, [place]   Chief Complaint  Patient presents with   Hyperlipidemia   Advice Only    Family history CAD    History of Present Illness:    Mary Fisher is a 56 y.o. female with family h/o CAD. Also has asthma and hyperlipidemia.   The most recent LDL in March was 183 and total cholesterol was 262.  Back on statin therapy, rosuvastatin 10 mg a day the LDL is 94 July 2023.  She has no cardiac complaints.  Her father had coronary bypass grafting in his early 69s.  Her paternal grandfather died of myocardial infarction.  Her mother had valve surgery but no coronary disease.  She is here for CV risk assessment.  She has random episodes of interscapular discomfort of uncertain etiology.  Past Medical History:  Diagnosis Date   Abdominal pain, epigastric    Acute cystitis    Acute diverticulitis    Anxiety    Asthma    Change in bowel habits    CN (constipation)    Diverticulitis    Family history of early CAD    Family history of hyperlipidemia    Generalized abdominal pain    Hemorrhage of rectum and anus    High cholesterol    History of COVID-19    Hypercholesterolemia    Hyperesthesia    Hyperlipidemia    Insomnia    Intractable nausea and vomiting    Kidney stone    Left sided abdominal pain    Reactive lymphoid hyperplasia    Trigeminal neuralgia of right side of face     Past Surgical History:  Procedure Laterality Date   SHOULDER SURGERY      Current Medications: Current Meds  Medication Sig   albuterol (VENTOLIN HFA) 108 (90 Base) MCG/ACT inhaler Inhale 2 puffs into the lungs every 4 (four) hours as needed for wheezing or shortness of breath.   Melatonin 10 MG TABS Take 10 mg by mouth once a week.   rosuvastatin (CRESTOR) 10 MG tablet Take 10 mg by mouth daily.    sertraline (ZOLOFT) 50 MG tablet Take 50 mg by mouth daily.   zolpidem (AMBIEN) 10 MG tablet      Allergies:   Metronidazole and Tramadol   Social History   Socioeconomic History   Marital status: Married    Spouse name: Not on file   Number of children: Not on file   Years of education: Not on file   Highest education level: Not on file  Occupational History   Not on file  Tobacco Use   Smoking status: Never   Smokeless tobacco: Never  Substance and Sexual Activity   Alcohol use: No   Drug use: No   Sexual activity: Not on file  Other Topics Concern   Not on file  Social History Narrative   Lives with husband in a 2 story home.  Has 2 children.  Works as a Production designer, theatre/television/film for [employer].  Education: high school.    Social Determinants of Health   Financial Resource Strain: Not on file  Food Insecurity: Not on file  Transportation Needs: Not on file  Physical Activity: Not on file  Stress: Not on file  Social Connections: Not on file  Family History: The patient's family history includes Heart disease in her father and mother.  ROS:   Please see the history of present illness.    Occasional into scapula discomfort that occurs randomly.  Can last hours once it occurs.  This has been happening off and on for a year.  All other systems reviewed and are negative.  EKGs/Labs/Other Studies Reviewed:    The following studies were reviewed today: No cardiac imaging data  Pelvic CT 2021 did not reveal any evidence of aortic or vascular atherosclerosis.  EKG:  EKG normal sinus rhythm, incomplete right bundle.  Prior tracing from January 2021, had fast heart rate but is otherwise identical to the current EKG.  Recent Labs: No results found for requested labs within last 365 days.  Recent Lipid Panel No results found for: "CHOL", "TRIG", "HDL", "CHOLHDL", "VLDL", "LDLCALC", "LDLDIRECT"  Physical Exam:    VS:  BP 100/68   Pulse 65   Ht 5\' 1"  (1.549 m)   Wt 127  lb 9.6 oz (57.9 kg)   SpO2 99%   BMI 24.11 kg/m     Wt Readings from Last 3 Encounters:  08/02/21 127 lb 9.6 oz (57.9 kg)  01/22/19 108 lb (49 kg)  11/01/15 113 lb 6 oz (51.4 kg)     GEN: Healthy in appearance. No acute distress HEENT: Normal NECK: No JVD. LYMPHATICS: No lymphadenopathy CARDIAC: No murmur. RRR no gallop, or edema. VASCULAR:  Normal Pulses. No bruits. RESPIRATORY:  Clear to auscultation without rales, wheezing or rhonchi  ABDOMEN: Soft, non-tender, non-distended, No pulsatile mass, MUSCULOSKELETAL: No deformity  SKIN: Warm and dry NEUROLOGIC:  Alert and oriented x 3 PSYCHIATRIC:  Normal affect   ASSESSMENT:    1. Hyperlipidemia LDL goal <70   2. Family history of early CAD   3. Uncomplicated asthma, unspecified asthma severity, unspecified whether persistent    PLAN:    In order of problems listed above:  She does not have hypertension, diabetes, or smokes cigarettes.  She does have a paternal history of coronary artery disease in both her grandfather and father.  She has had a lifelong history of hyperlipidemia with LDL cholesterol on no therapy being around 180 to 190 mg/dL.  She is now on 10 mg of rosuvastatin per day and LDL cholesterol is less than 100.  Medications have been prescribed over the years but she has been spotty and compliance. Father and grandfather had CAD Did not discuss   Recommend coronary calcium score to further risk stratify.  Interestingly, 2001 abdominal ultrasound did not reveal any mention of aortic atherosclerosis.  The calcium score will help determine current state relative to healed plaque and risk going forward.  Overall education and awareness concerning primary/secondary risk prevention was discussed in detail: LDL less than 70, hemoglobin A1c less than 7, blood pressure target less than 130/80 mmHg, >150 minutes of moderate aerobic activity per week, avoidance of smoking, weight control (via diet and exercise), and  continued surveillance/management of/for obstructive sleep apnea.    Medication Adjustments/Labs and Tests Ordered: Current medicines are reviewed at length with the patient today.  Concerns regarding medicines are outlined above.  Orders Placed This Encounter  Procedures   CT CARDIAC SCORING (SELF PAY ONLY)   EKG 12-Lead   No orders of the defined types were placed in this encounter.   Patient Instructions  Medication Instructions:  Your physician recommends that you continue on your current medications as directed. Please refer to the Current Medication list  given to you today.  *If you need a refill on your cardiac medications before your next appointment, please call your pharmacy*  Lab Work: NONE  Testing/Procedures: Your physician has recommended you have a coronary calcium score CT scan performed.  Follow-Up: At [facility], you and your health needs are our priority.  As part of our continuing mission to provide you with exceptional heart care, we have created designated Provider Care Teams.  These Care Teams include your primary Cardiologist (physician) and Advanced Practice Providers (APPs -  Physician Assistants and Nurse Practitioners) who all work together to provide you with the care you need, when you need it.  Your next appointment:   As needed  The format for your next appointment:   In Person  Provider:   Lesleigh Noe, MD {   Important Information About Sugar         Signed, Lesleigh Noe, MD  08/02/2021 11:25 AM    [facility]

## 2021-08-02 ENCOUNTER — Encounter: Payer: Self-pay | Admitting: Interventional Cardiology

## 2021-08-02 ENCOUNTER — Ambulatory Visit: Payer: Federal, State, Local not specified - PPO | Admitting: Interventional Cardiology

## 2021-08-02 VITALS — BP 100/68 | HR 65 | Ht 61.0 in | Wt 127.6 lb

## 2021-08-02 DIAGNOSIS — E785 Hyperlipidemia, unspecified: Secondary | ICD-10-CM

## 2021-08-02 DIAGNOSIS — I10 Essential (primary) hypertension: Secondary | ICD-10-CM

## 2021-08-02 DIAGNOSIS — J45909 Unspecified asthma, uncomplicated: Secondary | ICD-10-CM

## 2021-08-02 DIAGNOSIS — Z8249 Family history of ischemic heart disease and other diseases of the circulatory system: Secondary | ICD-10-CM

## 2021-08-02 NOTE — Patient Instructions (Signed)
Medication Instructions:  Your physician recommends that you continue on your current medications as directed. Please refer to the Current Medication list given to you today.  *If you need a refill on your cardiac medications before your next appointment, please call your pharmacy*  Lab Work: NONE  Testing/Procedures: Your physician has recommended you have a coronary calcium score CT scan performed.  Follow-Up: At [facility], you and your health needs are our priority.  As part of our continuing mission to provide you with exceptional heart care, we have created designated Provider Care Teams.  These Care Teams include your primary Cardiologist (physician) and Advanced Practice Providers (APPs -  Physician Assistants and Nurse Practitioners) who all work together to provide you with the care you need, when you need it.  Your next appointment:   As needed  The format for your next appointment:   In Person  Provider:   Lesleigh Noe, MD {   Important Information About Sugar

## 2021-08-21 ENCOUNTER — Ambulatory Visit (HOSPITAL_BASED_OUTPATIENT_CLINIC_OR_DEPARTMENT_OTHER)
Admission: RE | Admit: 2021-08-21 | Discharge: 2021-08-21 | Disposition: A | Discharge status: Home / Self Care | Payer: Federal, State, Local not specified - PPO | Source: Ambulatory Visit | Attending: Interventional Cardiology | Admitting: Interventional Cardiology

## 2021-08-21 DIAGNOSIS — Z8249 Family history of ischemic heart disease and other diseases of the circulatory system: Secondary | ICD-10-CM | POA: Insufficient documentation

## 2022-01-23 DIAGNOSIS — Z23 Encounter for immunization: Secondary | ICD-10-CM | POA: Diagnosis not present

## 2022-01-23 DIAGNOSIS — E782 Mixed hyperlipidemia: Secondary | ICD-10-CM | POA: Diagnosis not present

## 2022-01-23 DIAGNOSIS — G479 Sleep disorder, unspecified: Secondary | ICD-10-CM | POA: Diagnosis not present

## 2022-01-23 DIAGNOSIS — Z0000 Encounter for general adult medical examination without abnormal findings: Secondary | ICD-10-CM | POA: Diagnosis not present

## 2022-01-23 DIAGNOSIS — E2839 Other primary ovarian failure: Secondary | ICD-10-CM | POA: Diagnosis not present

## 2022-01-23 DIAGNOSIS — F411 Generalized anxiety disorder: Secondary | ICD-10-CM | POA: Diagnosis not present

## 2022-05-08 DIAGNOSIS — L603 Nail dystrophy: Secondary | ICD-10-CM | POA: Diagnosis not present

## 2022-05-08 DIAGNOSIS — L821 Other seborrheic keratosis: Secondary | ICD-10-CM | POA: Diagnosis not present

## 2022-05-08 DIAGNOSIS — D229 Melanocytic nevi, unspecified: Secondary | ICD-10-CM | POA: Diagnosis not present

## 2022-05-08 DIAGNOSIS — L814 Other melanin hyperpigmentation: Secondary | ICD-10-CM | POA: Diagnosis not present

## 2022-05-29 DIAGNOSIS — J45909 Unspecified asthma, uncomplicated: Secondary | ICD-10-CM | POA: Diagnosis not present

## 2022-05-29 DIAGNOSIS — J301 Allergic rhinitis due to pollen: Secondary | ICD-10-CM | POA: Diagnosis not present

## 2022-05-29 DIAGNOSIS — J0110 Acute frontal sinusitis, unspecified: Secondary | ICD-10-CM | POA: Diagnosis not present

## 2022-07-24 DIAGNOSIS — F419 Anxiety disorder, unspecified: Secondary | ICD-10-CM | POA: Diagnosis not present

## 2022-07-24 DIAGNOSIS — G479 Sleep disorder, unspecified: Secondary | ICD-10-CM | POA: Diagnosis not present

## 2022-07-24 DIAGNOSIS — E782 Mixed hyperlipidemia: Secondary | ICD-10-CM | POA: Diagnosis not present

## 2022-07-24 DIAGNOSIS — J45909 Unspecified asthma, uncomplicated: Secondary | ICD-10-CM | POA: Diagnosis not present

## 2022-07-26 ENCOUNTER — Other Ambulatory Visit: Payer: Self-pay | Admitting: Family Medicine

## 2022-07-26 DIAGNOSIS — E2839 Other primary ovarian failure: Secondary | ICD-10-CM

## 2022-10-14 ENCOUNTER — Ambulatory Visit (INDEPENDENT_AMBULATORY_CARE_PROVIDER_SITE_OTHER): Payer: Federal, State, Local not specified - PPO

## 2022-10-14 ENCOUNTER — Ambulatory Visit: Payer: Federal, State, Local not specified - PPO | Admitting: Podiatry

## 2022-10-14 DIAGNOSIS — M722 Plantar fascial fibromatosis: Secondary | ICD-10-CM | POA: Diagnosis not present

## 2022-10-14 MED ORDER — MELOXICAM 15 MG PO TABS: 15.0000 mg | ORAL_TABLET | Freq: Every day | ORAL | 0 refills | Status: DC | PRN

## 2022-10-14 NOTE — Patient Instructions (Addendum)

## 2022-10-14 NOTE — Progress Notes (Signed)
Subjective: Chief Complaint  Patient presents with   Plantar Fasciitis    Pt stated that her feet are bothering her again right foot is worse    57 year old female presents the office for above concerns.  She states that her pain is starting to come back and she is requesting steroid injections today.  No recent injury or changes otherwise she reports.  She said that some months she was doing better.  Is not working anymore.  Over the summer she was not wearing good shoes and it aggravated her.  No injuries.   Objective: AAO x3, NAD DP/PT pulses palpable bilaterally, CRT less than 3 seconds Tenderness to palpation along the plantar medial tubercle of the calcaneus at the insertion of plantar fascia on the left and right foot. There is no pain along the course of the plantar fascia within the arch of the foot. Plantar fascia appears to be intact. There is no pain with lateral compression of the calcaneus or pain with vibratory sensation. There is no pain along the course or insertion of the achilles tendon. No other areas of tenderness to bilateral lower extremities. No pain with calf compression, swelling, warmth, erythema  Assessment: Bilateral heel pain, plantar fasciitis  Plan: -All treatment options discussed with the patient including all alternatives, risks, complications.  -X-rays obtained and reviewed with the left foot were obtained.  No evidence of acute fracture.  Calcaneal spurring is present. -Steroid injections performed bilaterally.  See procedure note below. -Stretching, icing daily. -Prescribed mobic. Discussed side effects of the medication and directed to stop if any are to occur and call the office.  -Measured for inserts.  She states that she has previously measured and paid for them but she never received them. -Patient encouraged to call the office with any questions, concerns, change in symptoms.  Procedure: Injection Tendon/Ligament Discussed alternatives, risks,  complications and verbal consent was obtained.  Location: Bilateral plantar fascia at the glabrous junction; medial approach. Skin Prep: Alcohol. Injectate: 0.5cc 0.5% marcaine plain, 0.5 cc 2% lidocaine plain and, 1 cc kenalog 10. Disposition: Patient tolerated procedure well. Injection site dressed with a band-aid.  Post-injection care was discussed and return precautions discussed.     Vivi Barrack DPM

## 2022-11-25 ENCOUNTER — Ambulatory Visit: Payer: Federal, State, Local not specified - PPO | Admitting: Podiatry

## 2022-12-03 ENCOUNTER — Ambulatory Visit: Payer: Federal, State, Local not specified - PPO | Admitting: Podiatry

## 2022-12-03 ENCOUNTER — Ambulatory Visit: Payer: Federal, State, Local not specified - PPO

## 2022-12-03 DIAGNOSIS — M722 Plantar fascial fibromatosis: Secondary | ICD-10-CM | POA: Diagnosis not present

## 2022-12-03 NOTE — Progress Notes (Signed)
Subjective: Chief Complaint  Patient presents with   Foot Pain    Rm#[id number] Patient states she is here for orthotics no concerns today minimal pain in right foot but is doing better.   57 year old presents the office today for health evaluation of bilateral heel pain, plantar fasciitis.  She said that she is doing much better.  Injections were very helpful.  She presents today to pick up orthotics.  No new concerns.  Objective: AAO x3, NAD DP/PT pulses palpable bilaterally, CRT less than 3 seconds There is minimal tenderness palpation on the plantar aspect calcaneus on insertion of plantar fascia.  There is no pain with lateral compression of calcaneus.  No edema, erythema.  Flexor, extensor tendons were to be intact.  MMT 5/5. No pain with calf compression, swelling, warmth, erythema  Assessment: 57 year old female with plantar fasciitis, improving  Plan: -All treatment options discussed with the patient including all alternatives, risks, complications.  -Renal offered further injections today but we discussed continuing stretching, icing a regular basis.  Orthotics were dispensed and appear to be doing well but I discussed with her that if she needs any adjustments to let us know. -Patient encouraged to call the office with any questions, concerns, change in symptoms.   Vivi Barrack DPM

## 2022-12-03 NOTE — Progress Notes (Signed)
Patient presents today to pick up custom molded foot orthotics, diagnosed with PF by Dr. Ardelle Anton.   Orthotics were dispensed and fit was satisfactory. Reviewed instructions for break-in and wear. Written instructions given to patient.  Patient will follow up as needed.  Addison Bailey Cped CFo, CFm

## 2022-12-03 NOTE — Patient Instructions (Signed)

## 2023-02-04 DIAGNOSIS — J45909 Unspecified asthma, uncomplicated: Secondary | ICD-10-CM | POA: Diagnosis not present

## 2023-02-04 DIAGNOSIS — F419 Anxiety disorder, unspecified: Secondary | ICD-10-CM | POA: Diagnosis not present

## 2023-02-04 DIAGNOSIS — Z0000 Encounter for general adult medical examination without abnormal findings: Secondary | ICD-10-CM | POA: Diagnosis not present

## 2023-02-04 DIAGNOSIS — G4700 Insomnia, unspecified: Secondary | ICD-10-CM | POA: Diagnosis not present

## 2023-02-04 DIAGNOSIS — Z23 Encounter for immunization: Secondary | ICD-10-CM | POA: Diagnosis not present

## 2023-02-04 DIAGNOSIS — E782 Mixed hyperlipidemia: Secondary | ICD-10-CM | POA: Diagnosis not present

## 2023-02-05 ENCOUNTER — Other Ambulatory Visit: Payer: Self-pay | Admitting: Family Medicine

## 2023-02-05 DIAGNOSIS — Z1231 Encounter for screening mammogram for malignant neoplasm of breast: Secondary | ICD-10-CM

## 2023-02-20 ENCOUNTER — Other Ambulatory Visit: Payer: Self-pay | Admitting: Family Medicine

## 2023-02-20 DIAGNOSIS — Z1231 Encounter for screening mammogram for malignant neoplasm of breast: Secondary | ICD-10-CM

## 2023-02-26 ENCOUNTER — Ambulatory Visit
Admission: RE | Admit: 2023-02-26 | Discharge: 2023-02-26 | Disposition: A | Discharge status: Home / Self Care | Payer: BC Managed Care – PPO | Source: Ambulatory Visit

## 2023-02-26 DIAGNOSIS — Z1231 Encounter for screening mammogram for malignant neoplasm of breast: Secondary | ICD-10-CM | POA: Diagnosis not present

## 2023-05-05 ENCOUNTER — Ambulatory Visit
Admission: RE | Admit: 2023-05-05 | Discharge: 2023-05-05 | Disposition: A | Discharge status: Home / Self Care | Payer: Federal, State, Local not specified - PPO | Source: Ambulatory Visit | Attending: Family Medicine | Admitting: Family Medicine

## 2023-05-05 DIAGNOSIS — M810 Age-related osteoporosis without current pathological fracture: Secondary | ICD-10-CM | POA: Diagnosis not present

## 2023-05-05 DIAGNOSIS — E2839 Other primary ovarian failure: Secondary | ICD-10-CM

## 2023-05-19 DIAGNOSIS — Z23 Encounter for immunization: Secondary | ICD-10-CM | POA: Diagnosis not present

## 2023-07-22 DIAGNOSIS — E782 Mixed hyperlipidemia: Secondary | ICD-10-CM | POA: Diagnosis not present

## 2023-07-22 DIAGNOSIS — J45909 Unspecified asthma, uncomplicated: Secondary | ICD-10-CM | POA: Diagnosis not present

## 2023-07-22 DIAGNOSIS — F419 Anxiety disorder, unspecified: Secondary | ICD-10-CM | POA: Diagnosis not present

## 2023-07-22 DIAGNOSIS — G4700 Insomnia, unspecified: Secondary | ICD-10-CM | POA: Diagnosis not present

## 2023-08-18 DIAGNOSIS — L570 Actinic keratosis: Secondary | ICD-10-CM | POA: Diagnosis not present

## 2023-08-18 DIAGNOSIS — D1801 Hemangioma of skin and subcutaneous tissue: Secondary | ICD-10-CM | POA: Diagnosis not present

## 2023-08-18 DIAGNOSIS — L814 Other melanin hyperpigmentation: Secondary | ICD-10-CM | POA: Diagnosis not present

## 2023-08-18 DIAGNOSIS — L821 Other seborrheic keratosis: Secondary | ICD-10-CM | POA: Diagnosis not present

## 2023-09-06 ENCOUNTER — Telehealth: Admitting: Nurse Practitioner

## 2023-09-06 DIAGNOSIS — S90869A Insect bite (nonvenomous), unspecified foot, initial encounter: Secondary | ICD-10-CM | POA: Diagnosis not present

## 2023-09-06 DIAGNOSIS — L299 Pruritus, unspecified: Secondary | ICD-10-CM | POA: Diagnosis not present

## 2023-09-06 DIAGNOSIS — W57XXXA Bitten or stung by nonvenomous insect and other nonvenomous arthropods, initial encounter: Secondary | ICD-10-CM | POA: Diagnosis not present

## 2023-09-06 MED ORDER — SULFAMETHOXAZOLE-TRIMETHOPRIM 800-160 MG PO TABS: 1.0000 | ORAL_TABLET | Freq: Two times a day (BID) | ORAL | 0 refills | Status: AC

## 2023-09-06 NOTE — Progress Notes (Signed)
 E-Visit for Education officer, museum  T

## 2023-09-06 NOTE — Progress Notes (Signed)
 I have spent 5 minutes in review of e-visit questionnaire, review and updating patient chart, medical decision making and response to patient.   Claiborne Rigg, NP

## 2023-09-06 NOTE — Progress Notes (Signed)
 E Visit for Cellulitis  We are sorry that you are not feeling well. Here is how we plan to help!  Based on what you shared with me it looks like you have cellulitis.  Cellulitis looks like areas of skin redness, swelling, and warmth; it develops as a result of bacteria entering under the skin. Little red spots and/or bleeding can be seen in skin, and tiny surface sacs containing fluid can occur. Fever can be present. Cellulitis is almost always on one side of a body, and the lower limbs are the most common site of involvement.   I have prescribed:  Bactrim DS 1 tablet by mouth twice a day for 7 days  HOME CARE:  Take your medications as ordered and take all of them, even if the skin irritation appears to be healing.   GET HELP RIGHT AWAY IF:  Symptoms that don't begin to go away within 48 hours. Severe redness persists or worsens If the area turns color, spreads or swells. If it blisters and opens, develops yellow-brown crust or bleeds. You develop a fever or chills. If the pain increases or becomes unbearable.  Are unable to keep fluids and food down.  MAKE SURE YOU   Understand these instructions. Will watch your condition. Will get help right away if you are not doing well or get worse.  Thank you for choosing an e-visit.  Your e-visit answers were reviewed by a board certified advanced clinical practitioner to complete your personal care plan. Depending upon the condition, your plan could have included both over the counter or prescription medications.  Please review your pharmacy choice. Make sure the pharmacy is open so you can pick up prescription now. If there is a problem, you may contact your provider through [employer] and have the prescription routed to another pharmacy.  Your safety is important to [place]. If you have drug allergies check your prescription carefully.   For the next 24 hours you can use MyChart to ask questions about today's visit, request a  non-urgent call back, or ask for a work or school excuse. You will get an email in the next two days asking about your experience. I hope that your e-visit has been valuable and will speed your recovery.

## 2023-09-22 ENCOUNTER — Telehealth: Admitting: Physician Assistant

## 2023-09-22 DIAGNOSIS — H109 Unspecified conjunctivitis: Secondary | ICD-10-CM | POA: Diagnosis not present

## 2023-09-22 MED ORDER — OFLOXACIN 0.3 % OP SOLN: 1.0000 [drp] | Freq: Four times a day (QID) | OPHTHALMIC | 0 refills | Status: AC

## 2023-09-22 NOTE — Progress Notes (Signed)

## 2023-09-29 ENCOUNTER — Encounter

## 2023-10-02 DIAGNOSIS — H1013 Acute atopic conjunctivitis, bilateral: Secondary | ICD-10-CM | POA: Diagnosis not present
